# Patient Record
Sex: Male | Born: 1975 | Race: Black or African American | Hispanic: No
Health system: Southern US, Community
[De-identification: ages and names within clinical notes are randomized; demographics above are authoritative.]

## PROBLEM LIST (undated history)

## (undated) DIAGNOSIS — I609 Nontraumatic subarachnoid hemorrhage, unspecified: Secondary | ICD-10-CM

## (undated) DIAGNOSIS — S069XAA Unspecified intracranial injury with loss of consciousness status unknown, initial encounter: Secondary | ICD-10-CM

## (undated) DIAGNOSIS — H748X2 Other specified disorders of left middle ear and mastoid: Secondary | ICD-10-CM

## (undated) DIAGNOSIS — S02121S Fracture of orbital roof, right side, sequela: Secondary | ICD-10-CM

## (undated) DIAGNOSIS — S065XAA Traumatic subdural hemorrhage with loss of consciousness status unknown, initial encounter: Secondary | ICD-10-CM

## (undated) HISTORY — DX: Traumatic subdural hemorrhage with loss of consciousness status unknown, initial encounter: S06.5XAA

## (undated) HISTORY — DX: Unspecified intracranial injury with loss of consciousness status unknown, initial encounter: S06.9XAA

## (undated) HISTORY — DX: Fracture of orbital roof, right side, sequela: S02.121S

## (undated) HISTORY — DX: Other specified disorders of left middle ear and mastoid: H74.8X2

## (undated) HISTORY — DX: Nontraumatic subarachnoid hemorrhage, unspecified: I60.9

---

## 2013-12-01 ENCOUNTER — Emergency Department (HOSPITAL_COMMUNITY)
Admission: EM | Admit: 2013-12-01 | Discharge: 2013-12-01 | Disposition: A | Discharge status: Home / Self Care | Payer: Self-pay | Attending: Emergency Medicine | Admitting: Emergency Medicine

## 2013-12-01 ENCOUNTER — Encounter (HOSPITAL_COMMUNITY): Payer: Self-pay | Admitting: Emergency Medicine

## 2013-12-01 DIAGNOSIS — R52 Pain, unspecified: Secondary | ICD-10-CM | POA: Insufficient documentation

## 2013-12-01 DIAGNOSIS — J020 Streptococcal pharyngitis: Secondary | ICD-10-CM | POA: Insufficient documentation

## 2013-12-01 LAB — RAPID STREP SCREEN (MED CTR MEBANE ONLY): STREPTOCOCCUS, GROUP A SCREEN (DIRECT): POSITIVE — AB

## 2013-12-01 MED ORDER — IBUPROFEN 100 MG/5ML PO SUSP: 400.0000 mg | Freq: Four times a day (QID) | ORAL | Status: DC

## 2013-12-01 MED ORDER — IBUPROFEN 100 MG/5ML PO SUSP
600.0000 mg | Freq: Once | ORAL | Status: AC
  Administered 2013-12-01: 600 mg via ORAL
  Filled 2013-12-01 (×2): qty 30

## 2013-12-01 MED ORDER — PENICILLIN G BENZATHINE 1200000 UNIT/2ML IM SUSP
1.2000 10*6.[IU] | Freq: Once | INTRAMUSCULAR | Status: AC
  Administered 2013-12-01: 1.2 10*6.[IU] via INTRAMUSCULAR
  Filled 2013-12-01: qty 2

## 2013-12-01 MED ORDER — HYDROCODONE-ACETAMINOPHEN 7.5-325 MG/15ML PO SOLN
10.0000 mL | Freq: Once | ORAL | Status: AC
  Administered 2013-12-01: 10 mL via ORAL
  Filled 2013-12-01: qty 15

## 2013-12-01 MED ORDER — HYDROCODONE-ACETAMINOPHEN 7.5-325 MG/15ML PO SOLN: 10.0000 mL | ORAL | Status: DC | PRN

## 2013-12-01 NOTE — ED Provider Notes (Signed)
CSN: [id number]     Arrival date & time 12/01/13  1410 History  This chart was scribed for non-physician practitioner working with Harrold Donath R. Rubin Payor, MD, by Jarvis Morgan, ED Scribe. This patient was seen in room WTR8/WTR8 and the patient's care was started at 3:19 PM.    Chief Complaint  Patient presents with  . Sore Throat      The history is provided by the patient. No language interpreter was used.   HPI Comments: Ryan Nielsen is a 38 y.o. male who presents to the Emergency Department complaining of a moderate, constant sore throat since yesterday. Patient states he is having associated pain with swallowing, headache, generalized body aches and subjective fever. Patient denies any recent sick contacts. He denies any nausea, cough, congestion, rhinorrhea, or abdominal pain. States he is able to swallow liquids but makes throat pain worse.  No past medical history on file. No past surgical history on file. No family history on file. History  Substance Use Topics  . Smoking status: Never Smoker   . Smokeless tobacco: Not on file  . Alcohol Use: No    Review of Systems  Constitutional: Positive for fever (subjective).  HENT: Positive for sore throat and trouble swallowing (pain with swallowing). Negative for congestion and rhinorrhea.   Respiratory: Negative for cough.   Gastrointestinal: Negative for nausea, vomiting and abdominal pain.  All other systems reviewed and are negative.     Allergies  Review of patient's allergies indicates no known allergies.  Home Medications   Prior to Admission medications   Medication Sig Start Date End Date Taking? Authorizing Provider  HYDROcodone-acetaminophen (HYCET) 7.5-325 mg/15 ml solution Take 10 mLs by mouth every 4 (four) hours as needed for moderate pain or severe pain. 12/01/13   Junius Finner, PA-C  ibuprofen (CHILDS IBUPROFEN) 100 MG/5ML suspension Take 20 mLs (400 mg total) by mouth every 6 (six) hours. 12/01/13   Junius Finner, PA-C   Triage Vitals: BP 135/78  Pulse 85  Temp(Src) 98.5 F (36.9 C) (Oral)  Resp 16  SpO2 99%  Physical Exam  Nursing note and vitals reviewed. Constitutional: He is oriented to person, place, and time. He appears well-developed and well-nourished.  HENT:  Head: Normocephalic and atraumatic.  Mouth/Throat: Uvula is midline. No trismus in the jaw. Uvula swelling (mild) present. Posterior oropharyngeal edema and posterior oropharyngeal erythema present. No oropharyngeal exudate or tonsillar abscesses.  Eyes: EOM are normal.  Neck: Normal range of motion.  Cardiovascular: Normal rate, regular rhythm and normal heart sounds.  Exam reveals no gallop and no friction rub.   No murmur heard. Pulmonary/Chest: Effort normal and breath sounds normal. No respiratory distress. He has no wheezes. He has no rales.  Musculoskeletal: Normal range of motion.  Lymphadenopathy:    He has cervical adenopathy.  Neurological: He is alert and oriented to person, place, and time.  Skin: Skin is warm and dry.  Psychiatric: He has a normal mood and affect. His behavior is normal.    ED Course  Procedures (including critical care time)  DIAGNOSTIC STUDIES: Oxygen Saturation is 99% on RA, normal by my interpretation.    COORDINATION OF CARE: 3:27 PM- Will order Hycet and Bicillin LA injection. Pt advised of plan for treatment and pt agrees.     Labs Review Labs Reviewed  RAPID STREP SCREEN - Abnormal; Notable for the following:    Streptococcus, Group A Screen (Direct) POSITIVE (*)    All other components within normal limits  Imaging Review No results found.   EKG Interpretation None      MDM   Final diagnoses:  Strep throat    Pt is a 38yo male c/o sore throat that started yesterday. No sick contacts. No difficulty breathing.  Airway patent.  No abscess present. Rapid strep: positive.  Gave pt option of PCN in ED vs out pt PO PCN. Pt requested PCN tx in ED.  Will tx  pain with liquid ibuprofen and lortab. Advised to f/u in 3-4 days if not improving.  Return precautions provided. Pt verbalized understanding and agreement with tx plan.   I personally performed the services described in this documentation, which was scribed in my presence. The recorded information has been reviewed and is accurate.     Junius Finnerrin O'Malley, PA-C 12/01/13 1606

## 2013-12-01 NOTE — ED Notes (Signed)
He c/o sore throat and aches since yesterday.  He is in no distress, but is uncomfortable in appearance.

## 2013-12-02 NOTE — ED Provider Notes (Signed)
Medical screening examination/treatment/procedure(s) were performed by non-physician practitioner and as supervising physician I was immediately available for consultation/collaboration.   EKG Interpretation None       Juliet RudeNathan R. Rubin PayorPickering, MD 12/02/13 [phone]

## 2018-02-22 ENCOUNTER — Ambulatory Visit (INDEPENDENT_AMBULATORY_CARE_PROVIDER_SITE_OTHER): Payer: Self-pay

## 2018-02-22 ENCOUNTER — Encounter (HOSPITAL_COMMUNITY): Payer: Self-pay | Admitting: Emergency Medicine

## 2018-02-22 ENCOUNTER — Other Ambulatory Visit: Payer: Self-pay

## 2018-02-22 ENCOUNTER — Ambulatory Visit (HOSPITAL_COMMUNITY)
Admission: EM | Admit: 2018-02-22 | Discharge: 2018-02-22 | Disposition: A | Discharge status: Home / Self Care | Payer: Self-pay | Attending: Family Medicine | Admitting: Family Medicine

## 2018-02-22 DIAGNOSIS — M25571 Pain in right ankle and joints of right foot: Secondary | ICD-10-CM

## 2018-02-22 NOTE — ED Triage Notes (Addendum)
Right ankle pain after jumping over a fence.  Landed on ankle and heard a crack.  Patient reports this occurred 2 months ago.  Pain in right lateral ankle

## 2018-02-22 NOTE — Discharge Instructions (Addendum)
[facility] [address] [place], [place]  [phone]  You may use over the counter ibuprofen or acetaminophen as needed.

## 2018-02-28 NOTE — ED Provider Notes (Signed)
[facility]   [id number] 02/22/18 Arrival Time: 1845  ASSESSMENT & PLAN:  1. Right ankle pain, unspecified chronicity     Imaging: Dg Ankle Complete Right  Result Date: 02/22/2018 CLINICAL DATA:  Pain after trauma EXAM: RIGHT ANKLE - COMPLETE 3+ VIEW COMPARISON:  None. FINDINGS: A well corticated calcification adjacent the distal fibula is likely from an old avulsion injury. No soft tissue swelling identified. No acute fracture identified. IMPRESSION: Negative. Electronically Signed   By: Gerome Sam III M.D   On: 02/22/2018 19:44   Natural history and expected course discussed. Questions answered. Rest, ice, compression, elevation (RICE) therapy.  Recommend orthopaedic follow up.  Reviewed expectations re: course of current medical issues. Questions answered. Outlined signs and symptoms indicating need for more acute intervention. Patient verbalized understanding. After Visit Summary given.  SUBJECTIVE: History from: patient. Ryan Nielsen is a 42 y.o. male who reports intermittent moderate pain of his right ankle that has been intermittent since beginning; described as aching without radiation. Onset: gradual, reports jumping fence 2 months ago and 'hearing a crack'; ambulatory since with occasional exacerbation of lateral ankle pain. Relieved by: rest. Worsened by: movement. Associated symptoms: none reported. Extremity sensation changes or weakness: none. Self treatment: tried OTCs without relief of pain. History of similar: no  ROS: As per HPI.   OBJECTIVE:  Vitals:   02/22/18 1919  BP: 123/75  Pulse: 87  Resp: 18  Temp: 97.9 F (36.6 C)  TempSrc: Oral  SpO2: 100%    General appearance: alert; no distress Extremities: warm and well perfused; symmetrical with no gross deformities; diffuse tenderness over his right lateral ankle with no swelling and no bruising; ROM around area or areas of discomfort: normal CV: brisk extremity capillary refill Skin:  warm and dry Neurologic: normal gait; normal symmetric reflexes in all extremities; normal sensation in all extremities Psychological: alert and cooperative; normal mood and affect  No Known Allergies   Social History   Socioeconomic History  . Marital status: Single    Spouse name: Not on file  . Number of children: Not on file  . Years of education: Not on file  . Highest education level: Not on file  Occupational History  . Not on file  Social Needs  . Financial resource strain: Not on file  . Food insecurity:    Worry: Not on file    Inability: Not on file  . Transportation needs:    Medical: Not on file    Non-medical: Not on file  Tobacco Use  . Smoking status: Current Some Day Smoker  Substance and Sexual Activity  . Alcohol use: No  . Drug use: Never  . Sexual activity: Not on file  Lifestyle  . Physical activity:    Days per week: Not on file    Minutes per session: Not on file  . Stress: Not on file  Relationships  . Social connections:    Talks on phone: Not on file    Gets together: Not on file    Attends religious service: Not on file    Active member of club or organization: Not on file    Attends meetings of clubs or organizations: Not on file    Relationship status: Not on file  Other Topics Concern  . Not on file  Social History Narrative  . Not on file   History reviewed. No pertinent family history. History reviewed. No pertinent surgical history.    Mardella Layman, MD 02/28/18 [phone]

## 2018-06-07 ENCOUNTER — Encounter (HOSPITAL_COMMUNITY): Payer: Self-pay | Admitting: Emergency Medicine

## 2018-06-07 ENCOUNTER — Other Ambulatory Visit: Payer: Self-pay

## 2018-06-07 ENCOUNTER — Emergency Department (HOSPITAL_COMMUNITY)
Admission: EM | Admit: 2018-06-07 | Discharge: 2018-06-07 | Disposition: A | Discharge status: Home / Self Care | Payer: Self-pay | Attending: Emergency Medicine | Admitting: Emergency Medicine

## 2018-06-07 DIAGNOSIS — S0181XD Laceration without foreign body of other part of head, subsequent encounter: Secondary | ICD-10-CM | POA: Insufficient documentation

## 2018-06-07 DIAGNOSIS — Z4802 Encounter for removal of sutures: Secondary | ICD-10-CM

## 2018-06-07 DIAGNOSIS — X58XXXD Exposure to other specified factors, subsequent encounter: Secondary | ICD-10-CM | POA: Insufficient documentation

## 2018-06-07 DIAGNOSIS — F17200 Nicotine dependence, unspecified, uncomplicated: Secondary | ICD-10-CM | POA: Insufficient documentation

## 2018-06-07 MED ORDER — CEPHALEXIN 500 MG PO CAPS: 500.0000 mg | ORAL_CAPSULE | Freq: Four times a day (QID) | ORAL | 0 refills | Status: AC

## 2018-06-07 NOTE — ED Provider Notes (Signed)
[facility] Provider Note   CSN: [id number] Arrival date & time: 06/07/18  1513     History   Chief Complaint Chief Complaint  Patient presents with  . Suture / Staple Removal    HPI Ryan Nielsen is a 43 y.o. male who presents today for evaluation of suture removal.  He had sutures placed on his right side forehead on 12/31.  Chart review through care everywhere shows that he had 5 sutures placed.  He denies any fevers or drainage.  He is aware that he was supposed to get the sutures out 5 days after being placed.  HPI  History reviewed. No pertinent past medical history.  There are no active problems to display for this patient.   History reviewed. No pertinent surgical history.      Home Medications    Prior to Admission medications   Medication Sig Start Date End Date Taking? Authorizing Provider  cephALEXin (KEFLEX) 500 MG capsule Take 1 capsule (500 mg total) by mouth 4 (four) times daily for 7 days. 06/07/18 06/14/18  Cristina GongHammond, Drayton Tieu W, PA-C  HYDROcodone-acetaminophen (HYCET) 7.5-325 mg/15 ml solution Take 10 mLs by mouth every 4 (four) hours as needed for moderate pain or severe pain. 12/01/13   Lurene ShadowPhelps, Erin O, PA-C  ibuprofen (CHILDS IBUPROFEN) 100 MG/5ML suspension Take 20 mLs (400 mg total) by mouth every 6 (six) hours. 12/01/13   Lurene ShadowPhelps, Erin O, PA-C    Family History No family history on file.  Social History Social History   Tobacco Use  . Smoking status: Current Some Day Smoker  . Smokeless tobacco: Never Used  Substance Use Topics  . Alcohol use: Yes  . Drug use: Never     Allergies   Patient has no known allergies.   Review of Systems Review of Systems  Constitutional: Negative for chills and fever.  Skin: Negative for color change.  All other systems reviewed and are negative.    Physical Exam Updated Vital Signs There were no vitals taken for this visit.  Physical Exam Vitals signs and nursing  note reviewed.  Constitutional:      General: He is not in acute distress.    Appearance: He is normal weight.  HENT:     Head: Normocephalic.     Comments: 2cm laceration over right eyebrow with significant scabbing.  No abnormal drainage, underlying swelling, fluctuance or redness.  Skin:    Comments: Skin is warm, dry  Neurological:     General: No focal deficit present.     Mental Status: He is alert.  Psychiatric:        Mood and Affect: Mood normal.      ED Treatments / Results  Labs (all labs ordered are listed, but only abnormal results are displayed) Labs Reviewed - No data to display  EKG None  Radiology No results found.  Procedures .Suture Removal Date/Time: 06/07/2018 4:11 PM Performed by: Cristina GongHammond, Tonea Leiphart W, PA-C Authorized by: Cristina GongHammond, Jamil Armwood W, PA-C   Consent:    Consent obtained:  Verbal   Consent given by:  Patient   Risks discussed:  Bleeding, pain and wound separation (retained suture material, infection)   Alternatives discussed:  No treatment and alternative treatment Location:    Location:  Head/neck   Head/neck location:  Scalp Procedure details:    Wound appearance:  No signs of infection, good wound healing and clean (Large scab)   Number of sutures removed:  5 (Suture closest to eyebrow  appeared to be partially broken after removal.  area gently explored with out visualized suture material remaining. ) Post-procedure details:    Post-removal:  No dressing applied   Patient tolerance of procedure:  Tolerated well, no immediate complications Comments:     Prior to suture removal the area was cleaned with ChloraPrep   (including critical care time)  Medications Ordered in ED Medications - No data to display   Initial Impression / Assessment and Plan / ED Course  I have reviewed the triage vital signs and the nursing notes.  Pertinent labs & imaging results that were available during my care of the patient were reviewed by me and  considered in my medical decision making (see chart for details).    suture removal   Pt to ER for suture removal and wound check as above. Procedure tolerated well. Vitals normal, no signs of infection. Scar minimization & return precautions given at dc.  There was concern, based on short length of suture removed from suture closest to right eyebrow of retained suture material, or that it had broken.  Patient does not have good outpatient follow-up.  We discussed additional options.  Given possibility of retained foreign body with manipulation, he is given a prescription for Keflex with instructions on when he would need to start it.  Return precautions were discussed with patient who states their understanding.  At the time of discharge patient denied any unaddressed complaints or concerns.  Patient is agreeable for discharge home.    Final Clinical Impressions(s) / ED Diagnoses   Final diagnoses:  Encounter for removal of sutures    ED Discharge Orders         Ordered    cephALEXin (KEFLEX) 500 MG capsule  4 times daily     06/07/18 1618           Cristina Gong, [place] 06/07/18 1619    Wynetta Fines, MD 06/07/18 [phone]

## 2018-06-07 NOTE — ED Triage Notes (Signed)
Pt here for suture removal.  Sutures to R forehead since 12/31.  Denies complaint.

## 2018-06-07 NOTE — Discharge Instructions (Signed)
Please take Ibuprofen (Advil, motrin) and Tylenol (acetaminophen) to relieve your pain.  You may take up to 600 MG (3 pills) of normal strength ibuprofen every 8 hours as needed.  In between doses of ibuprofen you make take tylenol, up to 1,000 mg (two extra strength pills).  Do not take more than 3,000 mg tylenol in a 24 hour period.  Please check all medication labels as many medications such as pain and cold medications may contain tylenol.  Do not drink alcohol while taking these medications.  Do not take other NSAID'S while taking ibuprofen (such as aleve or naproxen).  Please take ibuprofen with food to decrease stomach upset.  I have given you a prescription for keflex which is an antibiotic.  If you develop significiant redness, increased pain, or have thick drainage from the wound please start taking these antibiotics.  You need to take the entire course.  If these do not improve your symptoms in 2 days or they get much worse seek additional medical care.  As we discussed if you have stitches material left in your wound it will gradually work its way out of your body.

## 2018-06-21 ENCOUNTER — Other Ambulatory Visit: Payer: Self-pay

## 2018-06-21 ENCOUNTER — Emergency Department (HOSPITAL_COMMUNITY)
Admission: EM | Admit: 2018-06-21 | Discharge: 2018-06-21 | Disposition: A | Discharge status: Home / Self Care | Payer: Self-pay | Attending: Emergency Medicine | Admitting: Emergency Medicine

## 2018-06-21 ENCOUNTER — Emergency Department (HOSPITAL_COMMUNITY): Payer: Self-pay

## 2018-06-21 DIAGNOSIS — F17290 Nicotine dependence, other tobacco product, uncomplicated: Secondary | ICD-10-CM | POA: Insufficient documentation

## 2018-06-21 DIAGNOSIS — J40 Bronchitis, not specified as acute or chronic: Secondary | ICD-10-CM | POA: Insufficient documentation

## 2018-06-21 DIAGNOSIS — Z79899 Other long term (current) drug therapy: Secondary | ICD-10-CM | POA: Insufficient documentation

## 2018-06-21 MED ORDER — PREDNISONE 50 MG PO TABS: 50.0000 mg | ORAL_TABLET | Freq: Every day | ORAL | 0 refills | Status: AC

## 2018-06-21 MED ORDER — ALBUTEROL SULFATE HFA 108 (90 BASE) MCG/ACT IN AERS: 1.0000 | INHALATION_SPRAY | Freq: Four times a day (QID) | RESPIRATORY_TRACT | 0 refills | Status: DC | PRN

## 2018-06-21 NOTE — ED Triage Notes (Signed)
Patient c/o chills, HA, cold/flu sx x3 days.

## 2018-06-21 NOTE — ED Provider Notes (Signed)
[facility] EMERGENCY DEPARTMENT Provider Note   CSN: [id number] Arrival date & time: 06/21/18  0243     History   Chief Complaint Chief Complaint  Patient presents with  . URI    HPI Ryan Nielsen is a 43 y.o. male.  The history is provided by the patient. No language interpreter was used.  URI  Presenting symptoms: congestion and cough   Severity:  Moderate Onset quality:  Gradual Duration:  3 days Timing:  Constant Progression:  Worsening Chronicity:  New Relieved by:  Nothing Worsened by:  Nothing Risk factors: no recent illness     No past medical history on file.  There are no active problems to display for this patient.   No past surgical history on file.      Home Medications    Prior to Admission medications   Medication Sig Start Date End Date Taking? Authorizing Provider  albuterol (PROVENTIL HFA;VENTOLIN HFA) 108 (90 Base) MCG/ACT inhaler Inhale 1-2 puffs into the lungs every 6 (six) hours as needed for wheezing or shortness of breath. 06/21/18   Elson Areas, PA-C  HYDROcodone-acetaminophen (HYCET) 7.5-325 mg/15 ml solution Take 10 mLs by mouth every 4 (four) hours as needed for moderate pain or severe pain. 12/01/13   Lurene Shadow, PA-C  ibuprofen (CHILDS IBUPROFEN) 100 MG/5ML suspension Take 20 mLs (400 mg total) by mouth every 6 (six) hours. 12/01/13   Lurene Shadow, PA-C  predniSONE (DELTASONE) 50 MG tablet Take 1 tablet (50 mg total) by mouth daily for 5 days. 06/21/18 06/26/18  Elson Areas, PA-C    Family History No family history on file.  Social History Social History   Tobacco Use  . Smoking status: Current Some Day Smoker  . Smokeless tobacco: Never Used  Substance Use Topics  . Alcohol use: Yes  . Drug use: Never     Allergies   Patient has no known allergies.   Review of Systems Review of Systems  HENT: Positive for congestion.   Respiratory: Positive for cough.   All other systems reviewed and are  negative.    Physical Exam Updated Vital Signs BP 135/70 (BP Location: Right Arm)   Pulse 94   Temp 98.3 F (36.8 C) (Oral)   Resp 19   SpO2 95%   Physical Exam Vitals signs and nursing note reviewed.  Constitutional:      Appearance: He is well-developed.  HENT:     Head: Normocephalic.     Right Ear: Tympanic membrane normal.     Left Ear: Tympanic membrane normal.     Nose: Nose normal.     Mouth/Throat:     Mouth: Mucous membranes are moist.  Neck:     Musculoskeletal: Normal range of motion.  Cardiovascular:     Rate and Rhythm: Normal rate.  Pulmonary:     Effort: Pulmonary effort is normal.  Abdominal:     General: There is no distension.  Musculoskeletal: Normal range of motion.  Skin:    General: Skin is warm.  Neurological:     Mental Status: He is alert and oriented to person, place, and time.  Psychiatric:        Mood and Affect: Mood normal.      ED Treatments / Results  Labs (all labs ordered are listed, but only abnormal results are displayed) Labs Reviewed - No data to display  EKG None  Radiology Dg Chest 2 View  Result Date: 06/21/2018 CLINICAL DATA:  Cough and congestion. EXAM: CHEST - 2 VIEW COMPARISON:  None. FINDINGS: The cardiomediastinal contours are normal. Mild bronchitic change. Pulmonary vasculature is normal. No consolidation, pleural effusion, or pneumothorax. No acute osseous abnormalities are seen. IMPRESSION: Mild bronchitic change. Electronically Signed   By: Narda Rutherford M.D.   On: 06/21/2018 03:29    Procedures Procedures (including critical care time)  Medications Ordered in ED Medications - No data to display   Initial Impression / Assessment and Plan / ED Course  I have reviewed the triage vital signs and the nursing notes.  Pertinent labs & imaging results that were available during my care of the patient were reviewed by me and considered in my medical decision making (see chart for details).     MDM   Chest xray shows bronchitis. Pt smokes cigars.  Pt given rx for prednisone and albuterol.   Final Clinical Impressions(s) / ED Diagnoses   Final diagnoses:  Bronchitis    ED Discharge Orders         Ordered    albuterol (PROVENTIL HFA;VENTOLIN HFA) 108 (90 Base) MCG/ACT inhaler  Every 6 hours PRN     06/21/18 0730    predniSONE (DELTASONE) 50 MG tablet  Daily     06/21/18 0730        An After Visit Summary was printed and given to the patient.    Elson Areas, [place] 06/21/18 8127    Gerhard Munch, MD 06/21/18 [phone]

## 2018-09-08 ENCOUNTER — Encounter: Payer: Self-pay | Admitting: *Deleted

## 2018-09-08 NOTE — Congregational Nurse Program (Signed)
COVID 19 Hotel Screening: Patient denies any concerns at this time.

## 2018-09-13 ENCOUNTER — Other Ambulatory Visit: Payer: Self-pay

## 2018-09-13 ENCOUNTER — Encounter (HOSPITAL_COMMUNITY): Payer: Self-pay

## 2018-09-13 ENCOUNTER — Ambulatory Visit (HOSPITAL_COMMUNITY)
Admission: EM | Admit: 2018-09-13 | Discharge: 2018-09-13 | Disposition: A | Discharge status: Home / Self Care | Payer: Self-pay | Attending: Family Medicine | Admitting: Family Medicine

## 2018-09-13 DIAGNOSIS — M545 Low back pain, unspecified: Secondary | ICD-10-CM

## 2018-09-13 MED ORDER — IBUPROFEN 800 MG PO TABS: 800.0000 mg | ORAL_TABLET | Freq: Three times a day (TID) | ORAL | 0 refills | Status: DC

## 2018-09-13 MED ORDER — CYCLOBENZAPRINE HCL 5 MG PO TABS: 5.0000 mg | ORAL_TABLET | Freq: Two times a day (BID) | ORAL | 0 refills | Status: DC | PRN
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
## 2018-09-13 NOTE — ED Notes (Signed)
Patient verbalizes understanding of discharge instructions. Opportunity for questioning and answers were provided. Patient discharged from [facility] by provider.  

## 2018-09-13 NOTE — Discharge Instructions (Signed)
Pain may worsen over the next 24 to 48 hours followed by gradual improvement day by day over the next couple of weeks  Use anti-inflammatories for pain/swelling. You may take up to 800 mg Ibuprofen every 8 hours with food. You may supplement Ibuprofen with Tylenol [phone] mg every 8 hours.   You may use flexeril as needed to help with pain. This is a muscle relaxer and causes sedation- please use only at bedtime or when you will be home and not have to drive/work- begin with 1 tablet, may increase to 2 if needed  Alternate ice and heat to back.  Avoid heavy lifting, avoid complete bedrest  Follow-up if pain continuing to worsen, developing weakness, numbness or tingling, issues with urination or bowel movements
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
## 2018-09-15 NOTE — Progress Notes (Signed)
COVID Hotel Screening performed. Temperature, PHQ-9, and need for medical care and medications assessed. No other needs assessed at this time.  Annabeth Tortora RN MSN 

## 2018-09-22 NOTE — Progress Notes (Signed)
COVID Hotel Screening performed. Temperature, PHQ-9, and need for medical care and medications assessed. No additional needs assessed at this time.  Gerene Nedd RN MSN 

## 2018-09-25 NOTE — Congregational Nurse Program (Signed)
Closing encounter per request. 

## 2018-09-29 NOTE — Progress Notes (Signed)
COVID Hotel Screening performed. Temperature, PHQ-9, and need for medical care and medications assessed. No additional needs assessed at this time.  Corlette Ciano RN MSN 

## 2018-10-06 ENCOUNTER — Encounter: Payer: Self-pay | Admitting: *Deleted

## 2018-10-06 NOTE — Progress Notes (Signed)
COVID Hotel Screening performed. Temperature, PHQ-9, and need for medical care and medications assessed. No additional needs assessed at this time.   Ryan Nielsen 

## 2018-10-13 NOTE — Progress Notes (Signed)
COVID Hotel Screening performed. Temperature, PHQ-9, and need for medical care and medications assessed. No additional needs assessed at this time.  Deontra Pereyra RN MSN 

## 2018-10-17 ENCOUNTER — Telehealth: Payer: Self-pay | Admitting: *Deleted

## 2018-10-17 DIAGNOSIS — Z20822 Contact with and (suspected) exposure to covid-19: Secondary | ICD-10-CM

## 2018-10-17 NOTE — Telephone Encounter (Signed)
Order placed for COVID-19 testing.  

## 2018-10-20 NOTE — Progress Notes (Signed)
COVID Hotel Screening performed. Temperature, PHQ-9, and need for medical care and medications assessed. Patient agreed to the COVID-19 testing. No additional needs assessed at this time.  Evelean Bigler RN MSN 

## 2018-10-25 LAB — NOVEL CORONAVIRUS, NAA: SARS-CoV-2, NAA: NOT DETECTED

## 2018-10-26 ENCOUNTER — Encounter: Payer: Self-pay | Admitting: *Deleted

## 2018-10-26 ENCOUNTER — Telehealth: Payer: Self-pay

## 2018-10-26 NOTE — Progress Notes (Signed)
COVID Hotel Screening performed. Temperature, PHQ-9, and need for medical care and medications assessed. No additional needs assessed at this time.  Marcy Sookdeo RN MSN 

## 2018-10-26 NOTE — Telephone Encounter (Signed)
Patient advised COVID test results were negative.

## 2018-11-10 NOTE — Progress Notes (Signed)
COVID Hotel Screening performed. Temperature, PHQ-9, and need for medical care and medications assessed. No additional needs assessed at this time. 

## 2018-12-01 ENCOUNTER — Other Ambulatory Visit: Payer: Self-pay

## 2018-12-01 DIAGNOSIS — Z20822 Contact with and (suspected) exposure to covid-19: Secondary | ICD-10-CM

## 2018-12-07 LAB — NOVEL CORONAVIRUS, NAA: SARS-CoV-2, NAA: NOT DETECTED

## 2019-01-14 NOTE — Progress Notes (Signed)
COVID-19 Screening performed. Temperature, PHQ-9, and need for medical care and medications assessed. No additional needs assessed at this time.  Barnet Benavides MSN, RN 

## 2019-01-21 NOTE — Progress Notes (Signed)
COVID-19 Screening performed. Temperature, PHQ-9, and need for medical care and medications assessed. No additional needs assessed at this time.  Amair Shrout MSN, RN 
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
## 2020-01-25 IMAGING — DX DG CHEST 2V
2 series · 2 of 2 positions shown · non-contrast
Comparison: None.

CLINICAL DATA: Cough and congestion.

EXAM:
CHEST - 2 VIEW

[chest pa]
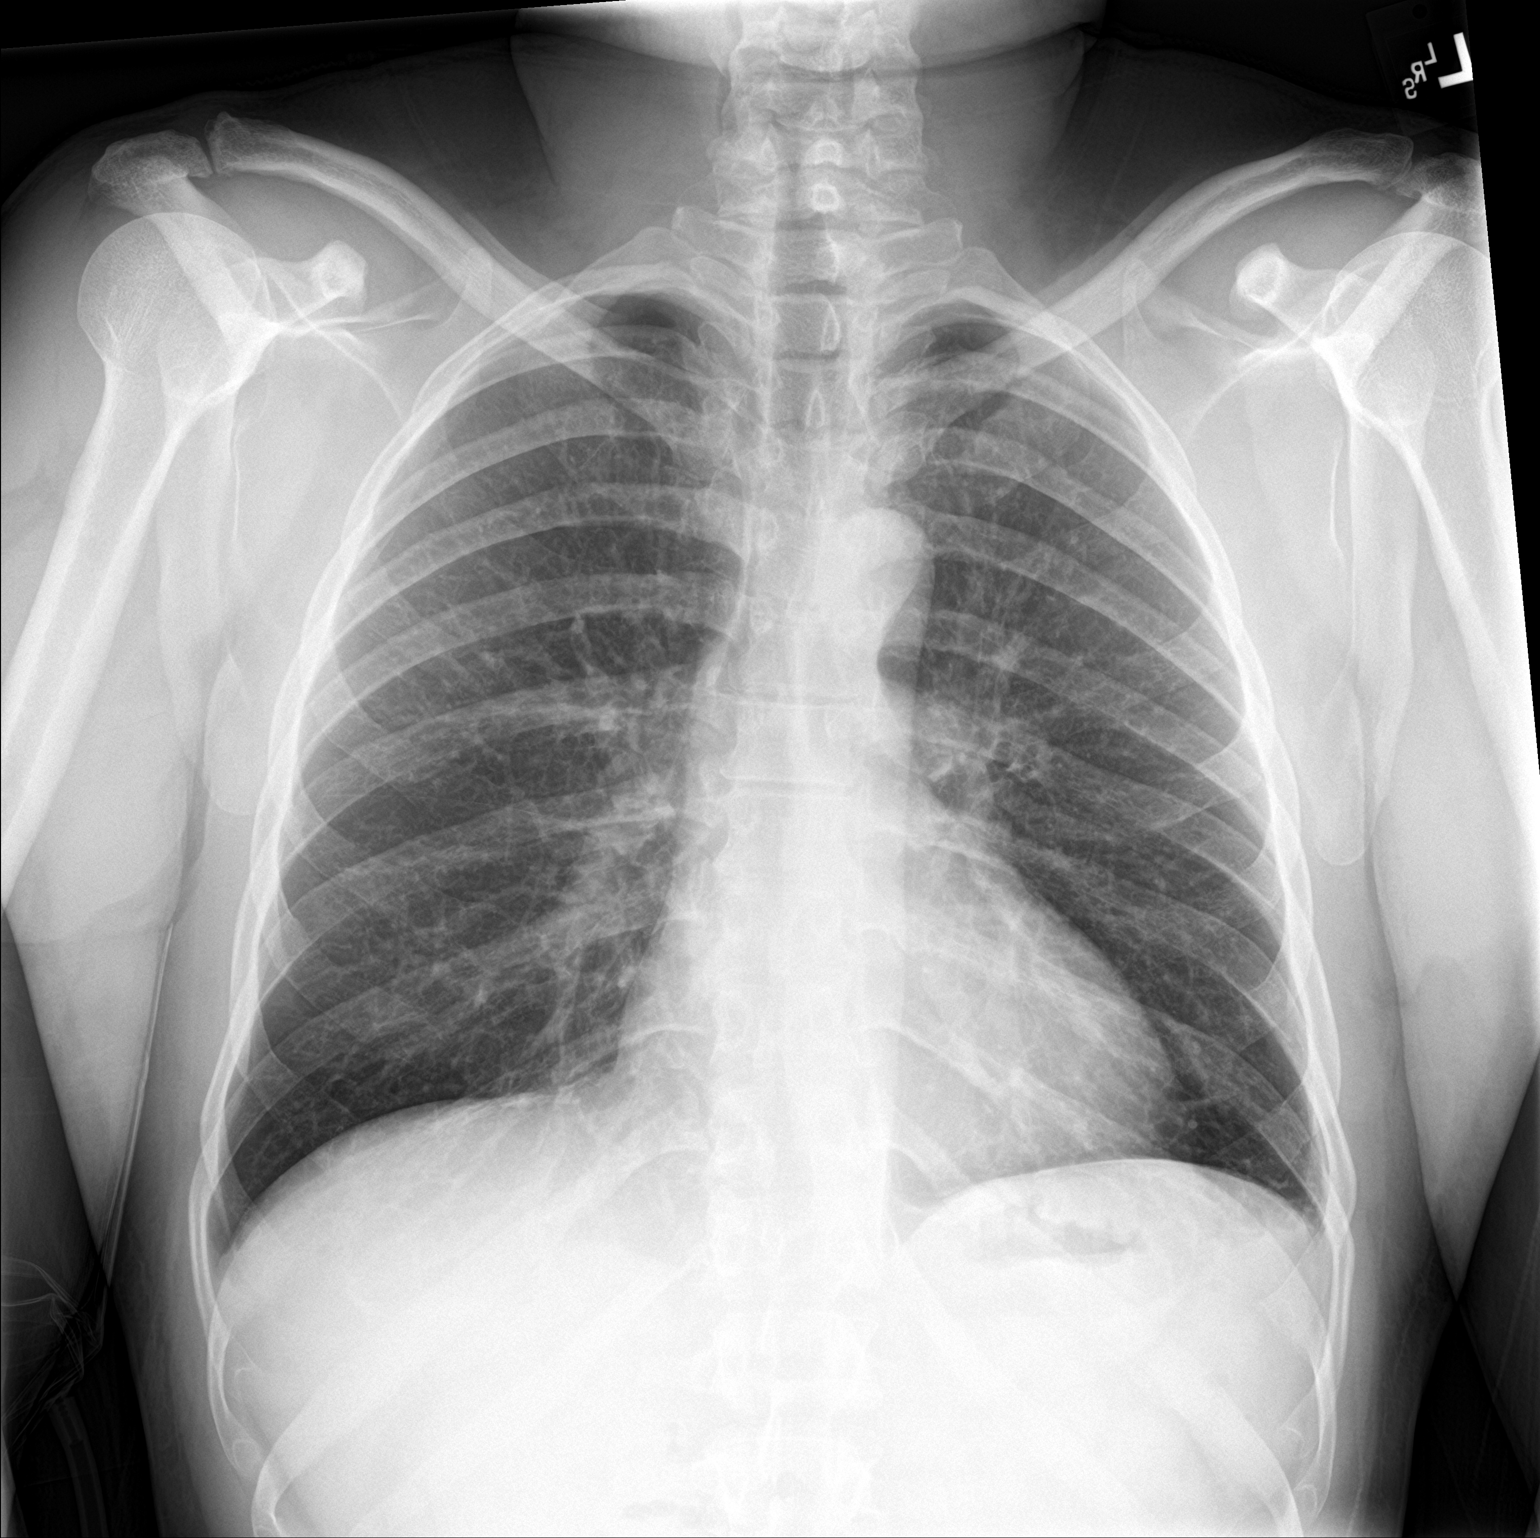

[chest lat]
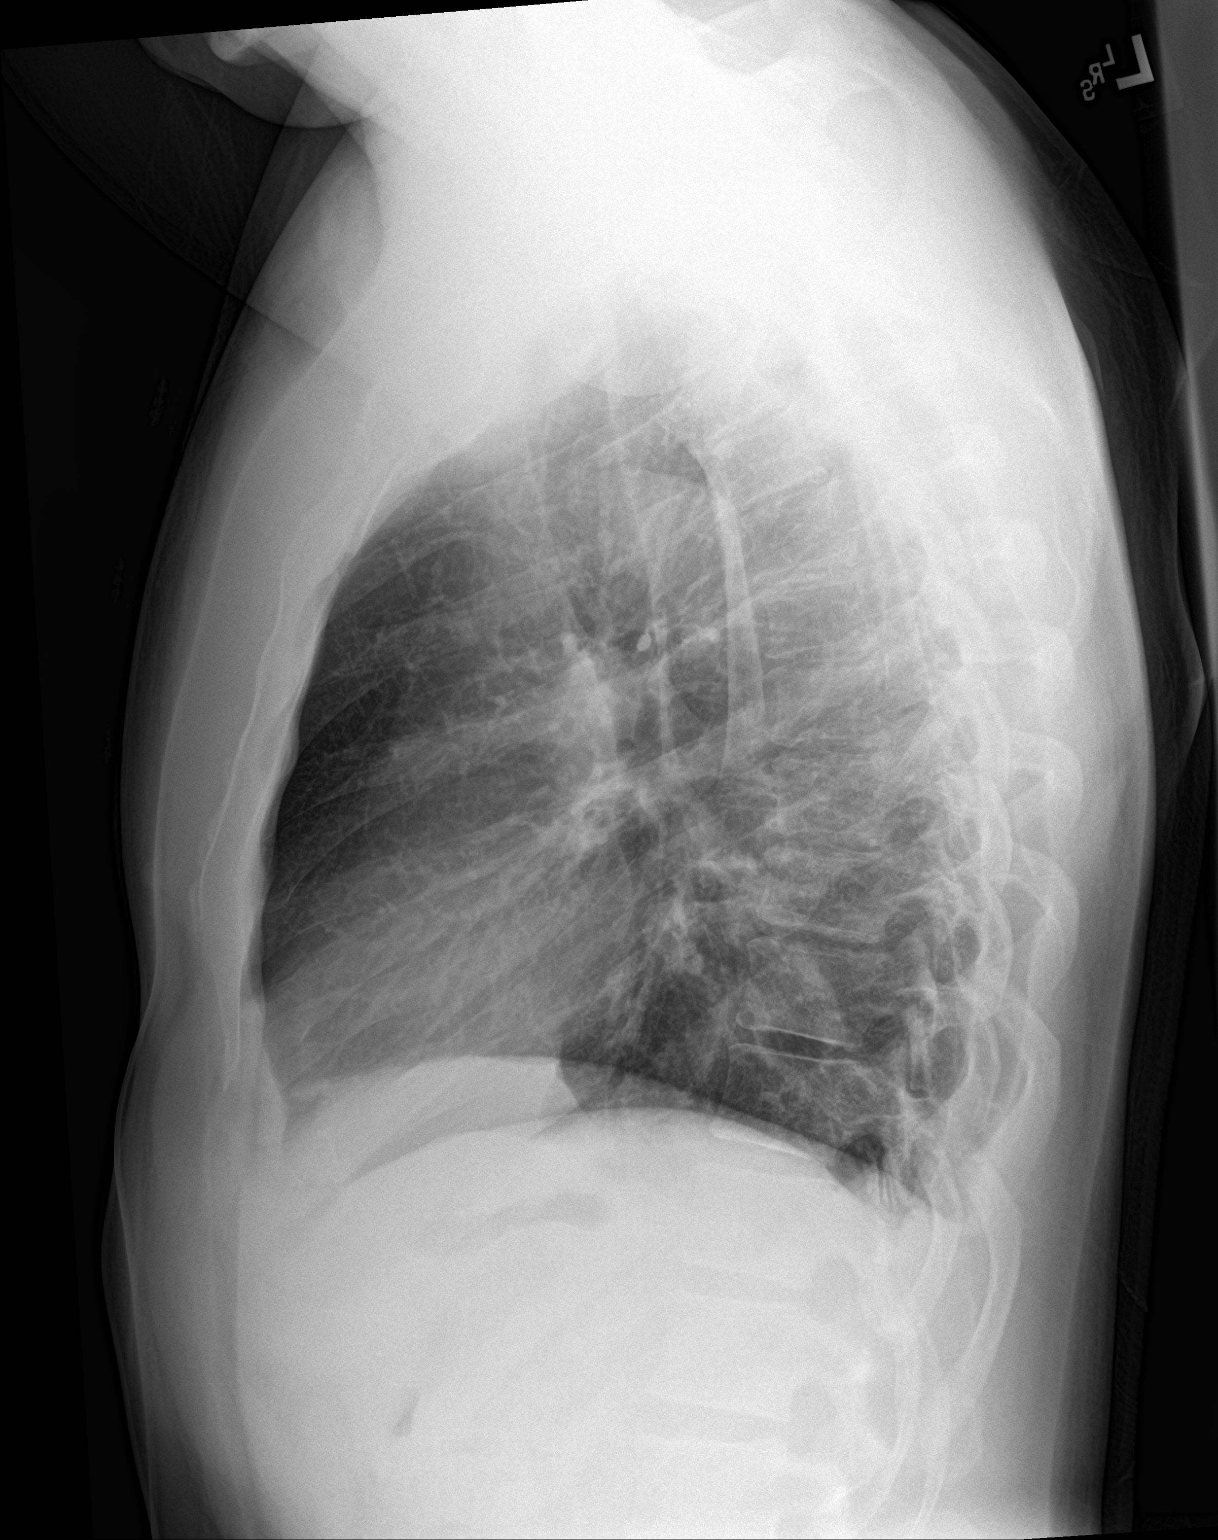

[2 of 2 positions shown; findings below may reference images not displayed]

FINDINGS: The cardiomediastinal contours are normal. Mild bronchitic change.
Pulmonary vasculature is normal. No consolidation, pleural effusion,
or pneumothorax. No acute osseous abnormalities are seen.
IMPRESSION: Mild bronchitic change.

## 2021-10-27 ENCOUNTER — Inpatient Hospital Stay (HOSPITAL_COMMUNITY)
Admission: EM | Admit: 2021-10-27 | Discharge: 2021-11-26 | DRG: 082 | Disposition: A | Discharge status: Home / Self Care | Payer: Self-pay | Attending: General Surgery | Admitting: General Surgery

## 2021-10-27 ENCOUNTER — Emergency Department (HOSPITAL_COMMUNITY): Payer: Self-pay

## 2021-10-27 ENCOUNTER — Encounter (HOSPITAL_COMMUNITY): Payer: Self-pay

## 2021-10-27 DIAGNOSIS — I609 Nontraumatic subarachnoid hemorrhage, unspecified: Secondary | ICD-10-CM | POA: Diagnosis not present

## 2021-10-27 DIAGNOSIS — R451 Restlessness and agitation: Secondary | ICD-10-CM | POA: Diagnosis present

## 2021-10-27 DIAGNOSIS — S065XAA Traumatic subdural hemorrhage with loss of consciousness status unknown, initial encounter: Principal | ICD-10-CM | POA: Diagnosis present

## 2021-10-27 DIAGNOSIS — H40051 Ocular hypertension, right eye: Secondary | ICD-10-CM | POA: Diagnosis present

## 2021-10-27 DIAGNOSIS — J14 Pneumonia due to Hemophilus influenzae: Secondary | ICD-10-CM | POA: Diagnosis not present

## 2021-10-27 DIAGNOSIS — R402242 Coma scale, best verbal response, confused conversation, at arrival to emergency department: Secondary | ICD-10-CM | POA: Diagnosis present

## 2021-10-27 DIAGNOSIS — E876 Hypokalemia: Secondary | ICD-10-CM

## 2021-10-27 DIAGNOSIS — R402362 Coma scale, best motor response, obeys commands, at arrival to emergency department: Secondary | ICD-10-CM | POA: Diagnosis present

## 2021-10-27 DIAGNOSIS — B9561 Methicillin susceptible Staphylococcus aureus infection as the cause of diseases classified elsewhere: Secondary | ICD-10-CM | POA: Diagnosis not present

## 2021-10-27 DIAGNOSIS — R402132 Coma scale, eyes open, to sound, at arrival to emergency department: Secondary | ICD-10-CM | POA: Diagnosis present

## 2021-10-27 DIAGNOSIS — Z20822 Contact with and (suspected) exposure to covid-19: Secondary | ICD-10-CM | POA: Diagnosis present

## 2021-10-27 DIAGNOSIS — H73892 Other specified disorders of tympanic membrane, left ear: Secondary | ICD-10-CM | POA: Diagnosis present

## 2021-10-27 DIAGNOSIS — S02121A Fracture of orbital roof, right side, initial encounter for closed fracture: Secondary | ICD-10-CM | POA: Diagnosis present

## 2021-10-27 DIAGNOSIS — Z781 Physical restraint status: Secondary | ICD-10-CM

## 2021-10-27 DIAGNOSIS — J9601 Acute respiratory failure with hypoxia: Secondary | ICD-10-CM | POA: Diagnosis present

## 2021-10-27 DIAGNOSIS — S066XAA Traumatic subarachnoid hemorrhage with loss of consciousness status unknown, initial encounter: Secondary | ICD-10-CM | POA: Diagnosis present

## 2021-10-27 DIAGNOSIS — S0219XA Other fracture of base of skull, initial encounter for closed fracture: Secondary | ICD-10-CM | POA: Diagnosis present

## 2021-10-27 LAB — URINALYSIS, ROUTINE W REFLEX MICROSCOPIC
Bilirubin Urine: NEGATIVE
Glucose, UA: NEGATIVE mg/dL
Ketones, ur: NEGATIVE mg/dL
Nitrite: NEGATIVE
Protein, ur: 100 mg/dL — AB
Specific Gravity, Urine: 1.026 (ref 1.005–1.030)
WBC, UA: >50 WBC/hpf — ABNORMAL HIGH (ref 0–5)
pH: 5 (ref 5.0–8.0)

## 2021-10-27 LAB — CBC
HCT: 45.9 % (ref 39.0–52.0)
Hemoglobin: 15.1 g/dL (ref 13.0–17.0)
MCH: 32.5 pg (ref 26.0–34.0)
MCHC: 32.9 g/dL (ref 30.0–36.0)
MCV: 98.7 fL (ref 80.0–100.0)
Platelets: 332 10*3/uL (ref 150–400)
RBC: 4.65 MIL/uL (ref 4.22–5.81)
RDW: 12.8 % (ref 11.5–15.5)
WBC: 15.3 10*3/uL — ABNORMAL HIGH (ref 4.0–10.5)
nRBC: 0 % (ref 0.0–0.2)

## 2021-10-27 LAB — LACTIC ACID, PLASMA: Lactic Acid, Venous: 6 mmol/L (ref 0.5–1.9)

## 2021-10-27 LAB — PROTIME-INR
INR: 1.1 (ref 0.8–1.2)
Prothrombin Time: 13.8 seconds (ref 11.4–15.2)

## 2021-10-27 LAB — COMPREHENSIVE METABOLIC PANEL
ALT: 26 U/L (ref 0–44)
AST: 50 U/L — ABNORMAL HIGH (ref 15–41)
Albumin: 3.5 g/dL (ref 3.5–5.0)
Alkaline Phosphatase: 56 U/L (ref 38–126)
Anion gap: 16 — ABNORMAL HIGH (ref 5–15)
BUN: 16 mg/dL (ref 6–20)
CO2: 19 mmol/L — ABNORMAL LOW (ref 22–32)
Calcium: 8.9 mg/dL (ref 8.9–10.3)
Chloride: 103 mmol/L (ref 98–111)
Creatinine, Ser: 1.15 mg/dL (ref 0.61–1.24)
GFR, Estimated: >60 mL/min (ref >60)
Glucose, Bld: 142 mg/dL — ABNORMAL HIGH (ref 70–99)
Potassium: 2.7 mmol/L — CL (ref 3.5–5.1)
Sodium: 138 mmol/L (ref 135–145)
Total Bilirubin: 0.8 mg/dL (ref 0.3–1.2)
Total Protein: 7.1 g/dL (ref 6.5–8.1)

## 2021-10-27 LAB — I-STAT CHEM 8, ED
BUN: 17 mg/dL (ref 6–20)
Calcium, Ion: 1.04 mmol/L — ABNORMAL LOW (ref 1.15–1.40)
Chloride: 104 mmol/L (ref 98–111)
Creatinine, Ser: 0.9 mg/dL (ref 0.61–1.24)
Glucose, Bld: 144 mg/dL — ABNORMAL HIGH (ref 70–99)
HCT: 47 % (ref 39.0–52.0)
Hemoglobin: 16 g/dL (ref 13.0–17.0)
Potassium: 2.8 mmol/L — ABNORMAL LOW (ref 3.5–5.1)
Sodium: 139 mmol/L (ref 135–145)
TCO2: 19 mmol/L — ABNORMAL LOW (ref 22–32)

## 2021-10-27 LAB — ETHANOL: Alcohol, Ethyl (B): <10 mg/dL (ref <10)

## 2021-10-27 MED ORDER — TRANEXAMIC ACID 1000 MG/10ML IV SOLN
1000.0000 mg | Freq: Once | INTRAVENOUS | Status: AC
  Administered 2021-10-28: 1000 mg via INTRAVENOUS
  Filled 2021-10-27: qty 10

## 2021-10-27 MED ORDER — FENTANYL CITRATE PF 50 MCG/ML IJ SOSY
100.0000 ug | PREFILLED_SYRINGE | Freq: Once | INTRAMUSCULAR | Status: AC
  Administered 2021-10-27: 100 ug via INTRAVENOUS

## 2021-10-27 MED ORDER — SUCCINYLCHOLINE CHLORIDE 20 MG/ML IJ SOLN
INTRAMUSCULAR | Status: AC | PRN
  Administered 2021-10-27: 120 mg via INTRAVENOUS

## 2021-10-27 MED ORDER — BRIMONIDINE TARTRATE 0.2 % OP SOLN
1.0000 [drp] | Freq: Once | OPHTHALMIC | Status: DC
  Filled 2021-10-27: qty 5

## 2021-10-27 MED ORDER — SODIUM CHLORIDE 0.9 % IV SOLN
INTRAVENOUS | Status: AC | PRN
  Administered 2021-10-27: 100 mL/h via INTRAVENOUS

## 2021-10-27 MED ORDER — POTASSIUM CHLORIDE 10 MEQ/100ML IV SOLN
10.0000 meq | INTRAVENOUS | Status: AC
  Administered 2021-10-27 – 2021-10-28 (×2): 10 meq via INTRAVENOUS
  Filled 2021-10-27 (×2): qty 100

## 2021-10-27 MED ORDER — MIDAZOLAM HCL 2 MG/2ML IJ SOLN
2.0000 mg | INTRAMUSCULAR | Status: DC | PRN
  Administered 2021-10-28 (×2): 2 mg via INTRAVENOUS
  Filled 2021-10-27: qty 2

## 2021-10-27 MED ORDER — LACTATED RINGERS IV BOLUS
1000.0000 mL | Freq: Once | INTRAVENOUS | Status: AC
  Administered 2021-10-27: 1000 mL via INTRAVENOUS

## 2021-10-27 MED ORDER — PROPOFOL 1000 MG/100ML IV EMUL
INTRAVENOUS | Status: AC | PRN
  Administered 2021-10-27: 20 ug/kg/min via INTRAVENOUS

## 2021-10-27 MED ORDER — MIDAZOLAM HCL 2 MG/2ML IJ SOLN
INTRAMUSCULAR | Status: AC
  Administered 2021-10-27: 2 mg via INTRAVENOUS
  Filled 2021-10-27: qty 2

## 2021-10-27 MED ORDER — MIDAZOLAM HCL 2 MG/2ML IJ SOLN
INTRAMUSCULAR | Status: AC
  Filled 2021-10-27: qty 2

## 2021-10-27 MED ORDER — MIDAZOLAM HCL 2 MG/2ML IJ SOLN
2.0000 mg | INTRAMUSCULAR | Status: DC | PRN
  Filled 2021-10-27: qty 2

## 2021-10-27 MED ORDER — TIMOLOL MALEATE 0.5 % OP SOLN: 1.0000 [drp] | Freq: Once | OPHTHALMIC | Status: DC

## 2021-10-27 MED ORDER — TRANEXAMIC ACID-NACL 1000-0.7 MG/100ML-% IV SOLN
1000.0000 mg | Freq: Once | INTRAVENOUS | Status: AC
  Administered 2021-10-27: 1000 mg via INTRAVENOUS

## 2021-10-27 MED ORDER — ACETAZOLAMIDE SODIUM 500 MG IJ SOLR
500.0000 mg | Freq: Once | INTRAMUSCULAR | Status: AC
  Administered 2021-10-27: 500 mg via INTRAVENOUS
  Filled 2021-10-27: qty 500

## 2021-10-27 MED ORDER — MIDAZOLAM HCL 2 MG/2ML IJ SOLN
2.0000 mg | Freq: Once | INTRAMUSCULAR | Status: AC
  Administered 2021-10-27: 2 mg via INTRAVENOUS

## 2021-10-27 MED ORDER — IOHEXOL 300 MG/ML  SOLN
80.0000 mL | Freq: Once | INTRAMUSCULAR | Status: AC | PRN
  Administered 2021-10-27: 80 mL via INTRAVENOUS

## 2021-10-27 MED ORDER — ETOMIDATE 2 MG/ML IV SOLN
INTRAVENOUS | Status: AC | PRN
  Administered 2021-10-27: 20 mg via INTRAVENOUS

## 2021-10-27 MED ORDER — MIDAZOLAM HCL 2 MG/2ML IJ SOLN: 2.0000 mg | Freq: Once | INTRAMUSCULAR | Status: AC

## 2021-10-27 MED ORDER — PROPOFOL 1000 MG/100ML IV EMUL
0.0000 ug/kg/min | INTRAVENOUS | Status: DC
  Administered 2021-10-27: 30 ug/kg/min via INTRAVENOUS

## 2021-10-27 MED ORDER — FENTANYL CITRATE (PF) 100 MCG/2ML IJ SOLN
INTRAMUSCULAR | Status: AC
  Filled 2021-10-27: qty 2
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
## 2021-10-27 NOTE — ED Notes (Signed)
Pt to be intubated.

## 2021-10-27 NOTE — Progress Notes (Signed)
Orthopedic Tech Progress Note Patient Details:  Ryan Nielsen 05/24/1875 [id number]  Patient ID: Ryan Nielsen, male   DOB: 05/24/1875, 46 y.o.   MRN: [id number] Level 1 trauma. Al Decant 10/27/2021, 10:46 PM

## 2021-10-28 ENCOUNTER — Inpatient Hospital Stay (HOSPITAL_COMMUNITY): Payer: Self-pay

## 2021-10-28 DIAGNOSIS — R451 Restlessness and agitation: Secondary | ICD-10-CM | POA: Diagnosis present

## 2021-10-28 DIAGNOSIS — B9561 Methicillin susceptible Staphylococcus aureus infection as the cause of diseases classified elsewhere: Secondary | ICD-10-CM | POA: Diagnosis not present

## 2021-10-28 DIAGNOSIS — S0219XA Other fracture of base of skull, initial encounter for closed fracture: Secondary | ICD-10-CM | POA: Diagnosis present

## 2021-10-28 DIAGNOSIS — R402362 Coma scale, best motor response, obeys commands, at arrival to emergency department: Secondary | ICD-10-CM | POA: Diagnosis present

## 2021-10-28 DIAGNOSIS — H40051 Ocular hypertension, right eye: Secondary | ICD-10-CM | POA: Diagnosis present

## 2021-10-28 DIAGNOSIS — J9601 Acute respiratory failure with hypoxia: Secondary | ICD-10-CM | POA: Diagnosis present

## 2021-10-28 DIAGNOSIS — S02121A Fracture of orbital roof, right side, initial encounter for closed fracture: Secondary | ICD-10-CM | POA: Diagnosis present

## 2021-10-28 DIAGNOSIS — E876 Hypokalemia: Secondary | ICD-10-CM | POA: Diagnosis not present

## 2021-10-28 DIAGNOSIS — J14 Pneumonia due to Hemophilus influenzae: Secondary | ICD-10-CM | POA: Diagnosis not present

## 2021-10-28 DIAGNOSIS — Z20822 Contact with and (suspected) exposure to covid-19: Secondary | ICD-10-CM | POA: Diagnosis present

## 2021-10-28 DIAGNOSIS — R402132 Coma scale, eyes open, to sound, at arrival to emergency department: Secondary | ICD-10-CM | POA: Diagnosis present

## 2021-10-28 DIAGNOSIS — H73892 Other specified disorders of tympanic membrane, left ear: Secondary | ICD-10-CM | POA: Diagnosis present

## 2021-10-28 DIAGNOSIS — I609 Nontraumatic subarachnoid hemorrhage, unspecified: Secondary | ICD-10-CM | POA: Diagnosis present

## 2021-10-28 DIAGNOSIS — Z781 Physical restraint status: Secondary | ICD-10-CM | POA: Diagnosis not present

## 2021-10-28 DIAGNOSIS — S066XAA Traumatic subarachnoid hemorrhage with loss of consciousness status unknown, initial encounter: Secondary | ICD-10-CM | POA: Diagnosis present

## 2021-10-28 DIAGNOSIS — R402242 Coma scale, best verbal response, confused conversation, at arrival to emergency department: Secondary | ICD-10-CM | POA: Diagnosis present

## 2021-10-28 DIAGNOSIS — S065XAA Traumatic subdural hemorrhage with loss of consciousness status unknown, initial encounter: Secondary | ICD-10-CM | POA: Diagnosis present

## 2021-10-28 LAB — POCT I-STAT 7, (LYTES, BLD GAS, ICA,H+H)
Acid-Base Excess: 2 mmol/L (ref 0.0–2.0)
Acid-base deficit: 4 mmol/L — ABNORMAL HIGH (ref 0.0–2.0)
Bicarbonate: 20.9 mmol/L (ref 20.0–28.0)
Bicarbonate: 25.6 mmol/L (ref 20.0–28.0)
Calcium, Ion: 1.26 mmol/L (ref 1.15–1.40)
Calcium, Ion: 1.15 mmol/L (ref 1.15–1.40)
HCT: 44 % (ref 39.0–52.0)
HCT: 43 % (ref 39.0–52.0)
Hemoglobin: 15 g/dL (ref 13.0–17.0)
Hemoglobin: 14.6 g/dL (ref 13.0–17.0)
O2 Saturation: 99 %
O2 Saturation: 100 %
Patient temperature: 99.3
Patient temperature: 97.1
Potassium: 3.6 mmol/L (ref 3.5–5.1)
Potassium: 3 mmol/L — ABNORMAL LOW (ref 3.5–5.1)
Sodium: 137 mmol/L (ref 135–145)
Sodium: 138 mmol/L (ref 135–145)
TCO2: 22 mmol/L (ref 22–32)
TCO2: 27 mmol/L (ref 22–32)
pCO2 arterial: 38.3 mmHg (ref 32–48)
pCO2 arterial: 36.1 mmHg (ref 32–48)
pH, Arterial: 7.346 — ABNORMAL LOW (ref 7.35–7.45)
pH, Arterial: 7.456 — ABNORMAL HIGH (ref 7.35–7.45)
pO2, Arterial: 143 mmHg — ABNORMAL HIGH (ref 83–108)
pO2, Arterial: 269 mmHg — ABNORMAL HIGH (ref 83–108)

## 2021-10-28 LAB — BASIC METABOLIC PANEL
Anion gap: 10 (ref 5–15)
BUN: 13 mg/dL (ref 6–20)
CO2: 21 mmol/L — ABNORMAL LOW (ref 22–32)
Calcium: 8.7 mg/dL — ABNORMAL LOW (ref 8.9–10.3)
Chloride: 104 mmol/L (ref 98–111)
Creatinine, Ser: 1.02 mg/dL (ref 0.61–1.24)
GFR, Estimated: >60 mL/min (ref >60)
Glucose, Bld: 123 mg/dL — ABNORMAL HIGH (ref 70–99)
Potassium: 3.6 mmol/L (ref 3.5–5.1)
Sodium: 135 mmol/L (ref 135–145)

## 2021-10-28 LAB — HIV ANTIBODY (ROUTINE TESTING W REFLEX): HIV Screen 4th Generation wRfx: NONREACTIVE

## 2021-10-28 LAB — CBC
HCT: 45.2 % (ref 39.0–52.0)
Hemoglobin: 14.7 g/dL (ref 13.0–17.0)
MCH: 32.7 pg (ref 26.0–34.0)
MCHC: 32.5 g/dL (ref 30.0–36.0)
MCV: 100.7 fL — ABNORMAL HIGH (ref 80.0–100.0)
Platelets: 286 10*3/uL (ref 150–400)
RBC: 4.49 MIL/uL (ref 4.22–5.81)
RDW: 12.8 % (ref 11.5–15.5)
WBC: 19.4 10*3/uL — ABNORMAL HIGH (ref 4.0–10.5)
nRBC: 0 % (ref 0.0–0.2)

## 2021-10-28 LAB — MRSA NEXT GEN BY PCR, NASAL: MRSA by PCR Next Gen: NOT DETECTED

## 2021-10-28 LAB — RESP PANEL BY RT-PCR (FLU A&B, COVID) ARPGX2
Influenza A by PCR: NEGATIVE
Influenza B by PCR: NEGATIVE
SARS Coronavirus 2 by RT PCR: NEGATIVE

## 2021-10-28 LAB — TRIGLYCERIDES: Triglycerides: 175 mg/dL — ABNORMAL HIGH (ref <150)

## 2021-10-28 LAB — SODIUM
Sodium: 133 mmol/L — ABNORMAL LOW (ref 135–145)
Sodium: 140 mmol/L (ref 135–145)

## 2021-10-28 LAB — GLUCOSE, CAPILLARY
Glucose-Capillary: 143 mg/dL — ABNORMAL HIGH (ref 70–99)
Glucose-Capillary: 136 mg/dL — ABNORMAL HIGH (ref 70–99)
Glucose-Capillary: 115 mg/dL — ABNORMAL HIGH (ref 70–99)

## 2021-10-28 MED ORDER — PIVOT 1.5 CAL PO LIQD
1000.0000 mL | ORAL | Status: DC
  Administered 2021-10-28 – 2021-11-11 (×12): 1000 mL

## 2021-10-28 MED ORDER — FENTANYL 2500MCG IN NS 250ML (10MCG/ML) PREMIX INFUSION
50.0000 ug/h | INTRAVENOUS | Status: DC
  Administered 2021-10-28 – 2021-11-02 (×6): 100 ug/h via INTRAVENOUS
  Administered 2021-11-03: 75 ug/h via INTRAVENOUS
  Administered 2021-11-04: 100 ug/h via INTRAVENOUS
  Administered 2021-11-05: 75 ug/h via INTRAVENOUS
  Administered 2021-11-06 – 2021-11-07 (×2): 50 ug/h via INTRAVENOUS
  Administered 2021-11-09 – 2021-11-10 (×2): 100 ug/h via INTRAVENOUS
  Filled 2021-10-28 (×14): qty 250

## 2021-10-28 MED ORDER — ETOMIDATE 2 MG/ML IV SOLN: 20.0000 mg | Freq: Once | INTRAVENOUS | Status: AC

## 2021-10-28 MED ORDER — ONDANSETRON HCL 4 MG/2ML IJ SOLN
4.0000 mg | Freq: Four times a day (QID) | INTRAMUSCULAR | Status: DC | PRN
  Administered 2021-10-28 – 2021-11-11 (×2): 4 mg via INTRAVENOUS
  Filled 2021-10-28 (×2): qty 2

## 2021-10-28 MED ORDER — OXYCODONE HCL 5 MG/5ML PO SOLN
5.0000 mg | ORAL | Status: DC | PRN
  Administered 2021-10-31 – 2021-11-03 (×5): 10 mg
  Administered 2021-11-04: 5 mg
  Administered 2021-11-04 – 2021-11-10 (×6): 10 mg
  Filled 2021-10-28 (×12): qty 10

## 2021-10-28 MED ORDER — ACETAMINOPHEN 500 MG PO TABS
1000.0000 mg | ORAL_TABLET | Freq: Four times a day (QID) | ORAL | Status: DC
  Administered 2021-10-28 – 2021-11-12 (×55): 1000 mg
  Filled 2021-10-28 (×58): qty 2

## 2021-10-28 MED ORDER — SODIUM CHLORIDE 0.9 % IV SOLN: INTRAVENOUS | Status: DC

## 2021-10-28 MED ORDER — CHLORHEXIDINE GLUCONATE CLOTH 2 % EX PADS
6.0000 | MEDICATED_PAD | Freq: Every day | CUTANEOUS | Status: DC
  Administered 2021-10-28 – 2021-11-15 (×19): 6 via TOPICAL

## 2021-10-28 MED ORDER — CHLORHEXIDINE GLUCONATE 0.12% ORAL RINSE (MEDLINE KIT)
15.0000 mL | Freq: Two times a day (BID) | OROMUCOSAL | Status: DC
Start: 2021-10-28 — End: 2021-11-14
  Administered 2021-10-28 – 2021-11-13 (×33): 15 mL via OROMUCOSAL

## 2021-10-28 MED ORDER — CIPROFLOXACIN-DEXAMETHASONE 0.3-0.1 % OT SUSP
2.0000 [drp] | Freq: Two times a day (BID) | OTIC | Status: AC
  Administered 2021-10-28 – 2021-11-04 (×14): 2 [drp] via OTIC
  Filled 2021-10-28 (×2): qty 7.5

## 2021-10-28 MED ORDER — SODIUM CHLORIDE 3 % IV BOLUS
250.0000 mL | Freq: Once | INTRAVENOUS | Status: AC
  Administered 2021-10-28: 250 mL via INTRAVENOUS
  Filled 2021-10-28: qty 500

## 2021-10-28 MED ORDER — IOHEXOL 350 MG/ML SOLN
60.0000 mL | Freq: Once | INTRAVENOUS | Status: AC | PRN
  Administered 2021-10-28: 60 mL via INTRAVENOUS

## 2021-10-28 MED ORDER — PROPOFOL 1000 MG/100ML IV EMUL
5.0000 ug/kg/min | INTRAVENOUS | Status: DC
  Administered 2021-10-28: 40 ug/kg/min via INTRAVENOUS
  Administered 2021-10-28 – 2021-11-01 (×24): 50 ug/kg/min via INTRAVENOUS
  Administered 2021-11-02 (×2): 40 ug/kg/min via INTRAVENOUS
  Administered 2021-11-02: 30 ug/kg/min via INTRAVENOUS
  Administered 2021-11-02: 50 ug/kg/min via INTRAVENOUS
  Administered 2021-11-03: 40 ug/kg/min via INTRAVENOUS
  Administered 2021-11-03: 55 ug/kg/min via INTRAVENOUS
  Administered 2021-11-03: 50 ug/kg/min via INTRAVENOUS
  Administered 2021-11-03: 30 ug/kg/min via INTRAVENOUS
  Administered 2021-11-04: 60 ug/kg/min via INTRAVENOUS
  Administered 2021-11-04: 40 ug/kg/min via INTRAVENOUS
  Administered 2021-11-04: 45 ug/kg/min via INTRAVENOUS
  Administered 2021-11-04 – 2021-11-05 (×3): 40 ug/kg/min via INTRAVENOUS
  Administered 2021-11-05: 35 ug/kg/min via INTRAVENOUS
  Administered 2021-11-06 (×3): 40 ug/kg/min via INTRAVENOUS
  Administered 2021-11-07 (×3): 50 ug/kg/min via INTRAVENOUS
  Administered 2021-11-07: 40 ug/kg/min via INTRAVENOUS
  Administered 2021-11-07: 45 ug/kg/min via INTRAVENOUS
  Administered 2021-11-08 (×2): 40 ug/kg/min via INTRAVENOUS
  Filled 2021-10-28 (×44): qty 100
  Filled 2021-10-28: qty 200
  Filled 2021-10-28 (×7): qty 100
  Filled 2021-10-28: qty 200

## 2021-10-28 MED ORDER — ETOMIDATE 2 MG/ML IV SOLN
INTRAVENOUS | Status: AC
  Administered 2021-10-28: 20 mg via INTRAVENOUS
  Filled 2021-10-28: qty 10

## 2021-10-28 MED ORDER — ADULT MULTIVITAMIN W/MINERALS CH
1.0000 | ORAL_TABLET | Freq: Every day | ORAL | Status: DC
  Administered 2021-10-28 – 2021-11-11 (×15): 1
  Filled 2021-10-28 (×15): qty 1

## 2021-10-28 MED ORDER — METHOCARBAMOL 500 MG PO TABS
1000.0000 mg | ORAL_TABLET | Freq: Three times a day (TID) | ORAL | Status: DC
  Administered 2021-10-28 – 2021-11-12 (×45): 1000 mg
  Filled 2021-10-28 (×46): qty 2

## 2021-10-28 MED ORDER — SUCCINYLCHOLINE CHLORIDE 200 MG/10ML IV SOSY
PREFILLED_SYRINGE | INTRAVENOUS | Status: AC
  Administered 2021-10-28: 100 mg via INTRAVENOUS
  Filled 2021-10-28: qty 10

## 2021-10-28 MED ORDER — DOCUSATE SODIUM 100 MG PO CAPS: 100.0000 mg | ORAL_CAPSULE | Freq: Two times a day (BID) | ORAL | Status: DC | PRN

## 2021-10-28 MED ORDER — VITAL HIGH PROTEIN PO LIQD: 1000.0000 mL | ORAL | Status: DC

## 2021-10-28 MED ORDER — PANTOPRAZOLE 2 MG/ML SUSPENSION
40.0000 mg | Freq: Every day | ORAL | Status: DC
  Administered 2021-10-28 – 2021-11-11 (×15): 40 mg
  Filled 2021-10-28 (×15): qty 20

## 2021-10-28 MED ORDER — DEXTROSE-NACL 5-0.9 % IV SOLN: INTRAVENOUS | Status: DC

## 2021-10-28 MED ORDER — FENTANYL BOLUS VIA INFUSION
50.0000 ug | INTRAVENOUS | Status: DC | PRN
  Administered 2021-10-29 – 2021-11-11 (×13): 50 ug via INTRAVENOUS

## 2021-10-28 MED ORDER — FENTANYL CITRATE PF 50 MCG/ML IJ SOSY
50.0000 ug | PREFILLED_SYRINGE | Freq: Once | INTRAMUSCULAR | Status: AC
  Administered 2021-10-28: 50 ug via INTRAVENOUS

## 2021-10-28 MED ORDER — DOCUSATE SODIUM 50 MG/5ML PO LIQD
100.0000 mg | Freq: Two times a day (BID) | ORAL | Status: DC
  Administered 2021-10-28 – 2021-11-11 (×21): 100 mg
  Filled 2021-10-28 (×19): qty 10

## 2021-10-28 MED ORDER — CHLORHEXIDINE GLUCONATE 0.12% ORAL RINSE (MEDLINE KIT)
15.0000 mL | Freq: Two times a day (BID) | OROMUCOSAL | Status: DC
Start: 2021-10-28 — End: 2021-10-28
  Administered 2021-10-28: 15 mL via OROMUCOSAL

## 2021-10-28 MED ORDER — ORAL CARE MOUTH RINSE
15.0000 mL | OROMUCOSAL | Status: DC
  Administered 2021-10-28 (×3): 15 mL via OROMUCOSAL

## 2021-10-28 MED ORDER — SODIUM CHLORIDE 3 % IV SOLN
INTRAVENOUS | Status: DC
  Administered 2021-10-29: 75 mL/h via INTRAVENOUS
  Filled 2021-10-28 (×7): qty 500

## 2021-10-28 MED ORDER — LEVETIRACETAM IN NACL 500 MG/100ML IV SOLN
500.0000 mg | Freq: Two times a day (BID) | INTRAVENOUS | Status: DC
  Administered 2021-10-28 – 2021-10-30 (×5): 500 mg via INTRAVENOUS
  Filled 2021-10-28 (×5): qty 100

## 2021-10-28 MED ORDER — PROSOURCE TF PO LIQD
45.0000 mL | Freq: Two times a day (BID) | ORAL | Status: DC
  Administered 2021-10-28: 45 mL
  Filled 2021-10-28: qty 45

## 2021-10-28 MED ORDER — BRIMONIDINE TARTRATE 0.2 % OP SOLN
1.0000 [drp] | Freq: Three times a day (TID) | OPHTHALMIC | Status: DC
  Administered 2021-10-28 – 2021-11-26 (×82): 1 [drp] via OPHTHALMIC
  Filled 2021-10-28: qty 5

## 2021-10-28 MED ORDER — SUCCINYLCHOLINE CHLORIDE 200 MG/10ML IV SOSY: 100.0000 mg | PREFILLED_SYRINGE | Freq: Once | INTRAVENOUS | Status: AC

## 2021-10-28 MED ORDER — ORAL CARE MOUTH RINSE
15.0000 mL | OROMUCOSAL | Status: DC
  Administered 2021-10-28 – 2021-11-14 (×176): 15 mL via OROMUCOSAL
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
## 2021-10-28 NOTE — TOC CAGE-AID Note (Signed)
Transition of Care [facility]) - CAGE-AID Screening   Patient Details  Name: Ryan Nielsen MRN: [id number] Date of Birth: 15-Jun-1975  Transition of Care [facility]) CM/SW Contact:    [name], LCSWA Phone Number: 10/28/2021, 12:57 PM   Clinical Narrative: Pt is unable to participate in Cage Aid.  Pt is on full vent.  [name], MSW, LCSW-A Pronouns:  She/Her/Hers [facility] Health[facility]itions of Care Clinical Social Worker Direct Number:  [phone] [name]  CAGE-AID Screening: Substance Abuse Screening unable to be completed due to: : Patient unable to participate             Substance Abuse Education Offered: No
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
## 2021-10-28 NOTE — Progress Notes (Signed)
RT assisted with transport of this patient from 4N 23 to CT and back while on full ventilatory support. Patient tolerated well with svs and no complications. RN currently at bedside. RT will continue to monitor patient.

## 2021-10-28 NOTE — Progress Notes (Signed)
Spoke with pharmacy about tranexamic acid which was due at 2330 on 6/6.   Pt admitted to 4N23 ~ 0200. Pharmacy stated okay to administer at this time.

## 2021-10-28 NOTE — ED Notes (Signed)
Valuables locked up with security total of $685.26, keys, cell phone, apple watch and pocket knife

## 2021-10-28 NOTE — Progress Notes (Signed)
RT called to patient's room due to patient self extubating.  Upon arrival, patient's vitals had noted to be within normal limits however patient was noted to not be responsive and slightly combative.  Patient was reintubated with an 8.0 ETT, by Dr. Bobbye Morton, without complications.
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
## 2021-10-28 NOTE — Progress Notes (Signed)
SLP Cancellation Note  Patient Details Name: Ryan Nielsen MRN: [id number] DOB: 08-28-1975   Cancelled treatment:       Reason Eval/Treat Not Completed: Patient not medically ready (SLP orders received; pt self-extubated and re-intubated thereafter. SLP will follow closely and complete evaluations as pt is medically ready.)  Kenni Newton I. Vear Clock, MS, CCC-SLP Acute [facility] Services Office number [phone] Pager [phone]  Scheryl Marten 10/28/2021, 11:04 AM

## 2021-10-28 NOTE — Procedures (Signed)
Intubation Procedure Note  Lamine Laton  [id number]  1976/02/24  Date:10/28/21  Time:9:16 AM   Provider Performing: Diamantina Monks    Procedure: Intubation (31500)  Indication(s) Respiratory Failure  Consent Unable to obtain consent due to emergent nature of procedure.   Anesthesia Etomidate and Succinylcholine   Time Out Verified patient identification, verified procedure, site/side was marked, verified correct patient position, special equipment/implants available, medications/allergies/relevant history reviewed, required imaging and test results available.   Sterile Technique Usual hand hygeine, masks, and gloves were used   Procedure Description Patient positioned in bed supine.  Sedation given as noted above.  Patient was intubated with endotracheal tube using Glidescope.  View was Grade 1 full glottis .  Number of attempts was 1.  Colorimetric CO2 detector was consistent with tracheal placement.   Complications/Tolerance None; patient tolerated the procedure well. Chest X-ray is ordered to verify placement.   EBL none   Specimen(s) None

## 2021-10-28 NOTE — Consult Note (Incomplete)
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 the sphenoid and left maxillary sinuses. Soft tissues: Soft tissue swelling is greatest in the right periorbital space. CT CERVICAL SPINE FINDINGS Alignment: No static subluxation. Facets are aligned. Occipital condyles and the lateral masses of C1-C2 are aligned. Vertebrae: No acute fracture. Soft tissues and spinal canal: No prevertebral fluid or swelling. No visible canal hematoma. Disc levels: No advanced spinal canal or neural foraminal stenosis. Upper chest: No pneumothorax, pulmonary nodule or pleural effusion. Other: Normal visualized paraspinal cervical soft tissues. IMPRESSION: 1. Small hemorrhagic contusions of the anterior left temporal lobe and both frontal poles. 2. Mixed subdural and subarachnoid blood over both anterior hemispheres and the lateral left hemisphere. No midline shift or other mass effect. 3. Otic capsule sparing complex left temporal  bone fracture violating the mastoid air cells and causing diastasis of the left limb of the lambdoid suture. 4. Minimally displaced fracture of the right orbital roof with a small amounts of superior extraconal gas in the right orbit. 5. No acute fracture or static subluxation of the cervical spine. Emergent findings were discussed with Dr. Posey Rea by Dr. Ladona Ridgel at 11:23 p.m. on 10/27/2021. Electronically Signed   By: Deatra Robinson M.D.   On: 10/28/2021 00:00    Review of Systems Blood pressure (!) 150/82, pulse 68, temperature 99.3 F (37.4 C), temperature source Axillary, resp. rate 18, height 5\' 8"  (1.727 m), weight 68 kg, SpO2 100 %. Physical Exam  Assessment/Plan: ***  , DNP, AGNP-C Neurosurgery Nurse Practitioner  [facility] [address], [address], [place], [place] P: [phone]    F: [phone]  10/28/2021 7:18 AM

## 2021-10-28 NOTE — Progress Notes (Signed)
Reviewed CT head.  There is expected blooming of subfrontal contusions and left temporal contusions, small amount of IVH.  Cisterns appear patent. No significant midline shift.  On exam, he briskly and purposefully localizes, with eyes open to stim, may have some aphasia component from his left temporal lobe contusions, left facial nerve injury.  To help with peri-contusional swelling, aiming for Na+ level in 145-155 range  with 3% saline over the next 3 days.  He will need a repeat CT head tomorrow to determine if safe to start lovenox.   Keppra 500 x 7 days.

## 2021-10-29 ENCOUNTER — Inpatient Hospital Stay (HOSPITAL_COMMUNITY): Payer: Self-pay

## 2021-10-29 LAB — BASIC METABOLIC PANEL
Anion gap: 4 — ABNORMAL LOW (ref 5–15)
BUN: 7 mg/dL (ref 6–20)
CO2: 19 mmol/L — ABNORMAL LOW (ref 22–32)
Calcium: 7.9 mg/dL — ABNORMAL LOW (ref 8.9–10.3)
Chloride: 118 mmol/L — ABNORMAL HIGH (ref 98–111)
Creatinine, Ser: 0.96 mg/dL (ref 0.61–1.24)
GFR, Estimated: >60 mL/min (ref >60)
Glucose, Bld: 106 mg/dL — ABNORMAL HIGH (ref 70–99)
Potassium: 3.7 mmol/L (ref 3.5–5.1)
Sodium: 141 mmol/L (ref 135–145)

## 2021-10-29 LAB — POCT I-STAT 7, (LYTES, BLD GAS, ICA,H+H)
Acid-base deficit: 6 mmol/L — ABNORMAL HIGH (ref 0.0–2.0)
Bicarbonate: 19.3 mmol/L — ABNORMAL LOW (ref 20.0–28.0)
Calcium, Ion: 1.27 mmol/L (ref 1.15–1.40)
HCT: 33 % — ABNORMAL LOW (ref 39.0–52.0)
Hemoglobin: 11.2 g/dL — ABNORMAL LOW (ref 13.0–17.0)
O2 Saturation: 99 %
Patient temperature: 99.3
Potassium: 3.6 mmol/L (ref 3.5–5.1)
Sodium: 146 mmol/L — ABNORMAL HIGH (ref 135–145)
TCO2: 20 mmol/L — ABNORMAL LOW (ref 22–32)
pCO2 arterial: 37.4 mmHg (ref 32–48)
pH, Arterial: 7.322 — ABNORMAL LOW (ref 7.35–7.45)
pO2, Arterial: 135 mmHg — ABNORMAL HIGH (ref 83–108)

## 2021-10-29 LAB — GLUCOSE, CAPILLARY
Glucose-Capillary: 133 mg/dL — ABNORMAL HIGH (ref 70–99)
Glucose-Capillary: 102 mg/dL — ABNORMAL HIGH (ref 70–99)
Glucose-Capillary: 151 mg/dL — ABNORMAL HIGH (ref 70–99)
Glucose-Capillary: 108 mg/dL — ABNORMAL HIGH (ref 70–99)
Glucose-Capillary: 109 mg/dL — ABNORMAL HIGH (ref 70–99)
Glucose-Capillary: 140 mg/dL — ABNORMAL HIGH (ref 70–99)

## 2021-10-29 LAB — CBC
HCT: 36.1 % — ABNORMAL LOW (ref 39.0–52.0)
Hemoglobin: 11.8 g/dL — ABNORMAL LOW (ref 13.0–17.0)
MCH: 33 pg (ref 26.0–34.0)
MCHC: 32.7 g/dL (ref 30.0–36.0)
MCV: 100.8 fL — ABNORMAL HIGH (ref 80.0–100.0)
Platelets: 217 10*3/uL (ref 150–400)
RBC: 3.58 MIL/uL — ABNORMAL LOW (ref 4.22–5.81)
RDW: 13.2 % (ref 11.5–15.5)
WBC: 11 10*3/uL — ABNORMAL HIGH (ref 4.0–10.5)
nRBC: 0 % (ref 0.0–0.2)

## 2021-10-29 LAB — URINE CULTURE: Culture: 20000 — AB

## 2021-10-29 LAB — SODIUM
Sodium: 141 mmol/L (ref 135–145)
Sodium: 145 mmol/L (ref 135–145)
Sodium: 144 mmol/L (ref 135–145)

## 2021-10-29 MED ORDER — ENOXAPARIN SODIUM 30 MG/0.3ML IJ SOSY
30.0000 mg | PREFILLED_SYRINGE | Freq: Two times a day (BID) | INTRAMUSCULAR | Status: DC
  Administered 2021-10-29 – 2021-11-25 (×51): 30 mg via SUBCUTANEOUS
  Filled 2021-10-29 (×56): qty 0.3
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
## 2021-10-29 NOTE — Progress Notes (Signed)
Pt transported by transport, RT and RN from 4N23 to CT2 and back w/o complications

## 2021-10-30 ENCOUNTER — Inpatient Hospital Stay (HOSPITAL_COMMUNITY): Payer: Self-pay

## 2021-10-30 LAB — CBC
HCT: 34.2 % — ABNORMAL LOW (ref 39.0–52.0)
Hemoglobin: 11 g/dL — ABNORMAL LOW (ref 13.0–17.0)
MCH: 32.8 pg (ref 26.0–34.0)
MCHC: 32.2 g/dL (ref 30.0–36.0)
MCV: 102.1 fL — ABNORMAL HIGH (ref 80.0–100.0)
Platelets: 201 10*3/uL (ref 150–400)
RBC: 3.35 MIL/uL — ABNORMAL LOW (ref 4.22–5.81)
RDW: 13.7 % (ref 11.5–15.5)
WBC: 10.1 10*3/uL (ref 4.0–10.5)
nRBC: 0 % (ref 0.0–0.2)

## 2021-10-30 LAB — BLOOD GAS, ARTERIAL
Acid-base deficit: 3.3 mmol/L — ABNORMAL HIGH (ref 0.0–2.0)
Bicarbonate: 22.1 mmol/L (ref 20.0–28.0)
Drawn by: 38235
O2 Saturation: 99.6 %
Patient temperature: 37.5
pCO2 arterial: 41 mmHg (ref 32–48)
pH, Arterial: 7.34 — ABNORMAL LOW (ref 7.35–7.45)
pO2, Arterial: 137 mmHg — ABNORMAL HIGH (ref 83–108)

## 2021-10-30 LAB — BASIC METABOLIC PANEL
Anion gap: 5 (ref 5–15)
BUN: 8 mg/dL (ref 6–20)
CO2: 20 mmol/L — ABNORMAL LOW (ref 22–32)
Calcium: 7.7 mg/dL — ABNORMAL LOW (ref 8.9–10.3)
Chloride: 124 mmol/L — ABNORMAL HIGH (ref 98–111)
Creatinine, Ser: 0.75 mg/dL (ref 0.61–1.24)
GFR, Estimated: >60 mL/min (ref >60)
Glucose, Bld: 97 mg/dL (ref 70–99)
Potassium: 3.6 mmol/L (ref 3.5–5.1)
Sodium: 149 mmol/L — ABNORMAL HIGH (ref 135–145)

## 2021-10-30 LAB — GLUCOSE, CAPILLARY
Glucose-Capillary: 106 mg/dL — ABNORMAL HIGH (ref 70–99)
Glucose-Capillary: 118 mg/dL — ABNORMAL HIGH (ref 70–99)
Glucose-Capillary: 113 mg/dL — ABNORMAL HIGH (ref 70–99)
Glucose-Capillary: 96 mg/dL (ref 70–99)
Glucose-Capillary: 104 mg/dL — ABNORMAL HIGH (ref 70–99)
Glucose-Capillary: 130 mg/dL — ABNORMAL HIGH (ref 70–99)

## 2021-10-30 LAB — SODIUM
Sodium: 150 mmol/L — ABNORMAL HIGH (ref 135–145)
Sodium: 155 mmol/L — ABNORMAL HIGH (ref 135–145)
Sodium: 155 mmol/L — ABNORMAL HIGH (ref 135–145)

## 2021-10-30 MED ORDER — POLYETHYLENE GLYCOL 3350 17 G PO PACK
17.0000 g | PACK | Freq: Every day | ORAL | Status: DC
  Administered 2021-10-31 – 2021-11-01 (×2): 17 g
  Filled 2021-10-30 (×2): qty 1

## 2021-10-30 MED ORDER — LEVETIRACETAM 100 MG/ML PO SOLN
500.0000 mg | Freq: Two times a day (BID) | ORAL | Status: AC
  Administered 2021-10-30 – 2021-11-03 (×9): 500 mg
  Filled 2021-10-30 (×9): qty 5

## 2021-10-30 MED ORDER — POLYETHYLENE GLYCOL 3350 17 G PO PACK
17.0000 g | PACK | Freq: Every day | ORAL | Status: DC
Start: 2021-10-30 — End: 2021-10-30
  Administered 2021-10-30: 17 g via ORAL
  Filled 2021-10-30: qty 1

## 2021-10-30 MED ORDER — QUETIAPINE FUMARATE 25 MG PO TABS
50.0000 mg | ORAL_TABLET | Freq: Two times a day (BID) | ORAL | Status: DC
  Administered 2021-10-30 – 2021-11-04 (×12): 50 mg
  Filled 2021-10-30 (×12): qty 2

## 2021-10-30 NOTE — Progress Notes (Signed)
SLP Cancellation Note  Patient Details Name: Ryan Nielsen MRN: [id number] DOB: [birth year]   Cancelled treatment:       Reason Eval/Treat Not Completed: Patient not medically ready   Darel Ricketts, Katherene Ponto 10/30/2021, 8:14 AM
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
## 2021-10-30 NOTE — Care Management (Signed)
Found phone number for patient's aunt in old chart.  Left message for [facility], phone number [phone] to call [facility] ICU and ask for patient's nurse.    Quintella Baton, RN, BSN  Trauma/Neuro ICU Case Manager [phone]
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
## 2021-10-30 NOTE — Progress Notes (Signed)
Neurosurgery  Patient seen and examined.  Intubated Opens eyes to voice.  Briskly and purposefully localizing x 4, does not follow commands.  Left facial droop. Dried blood in left ear  Can wean 3% off starting tomorrow, keep normonatremic Lovenox for dvt prophylaxis Keppra x 4 more days

## 2021-10-31 LAB — BASIC METABOLIC PANEL
Anion gap: 6 (ref 5–15)
BUN: 10 mg/dL (ref 6–20)
CO2: 23 mmol/L (ref 22–32)
Calcium: 8.4 mg/dL — ABNORMAL LOW (ref 8.9–10.3)
Chloride: 124 mmol/L — ABNORMAL HIGH (ref 98–111)
Creatinine, Ser: 0.8 mg/dL (ref 0.61–1.24)
GFR, Estimated: >60 mL/min (ref >60)
Glucose, Bld: 146 mg/dL — ABNORMAL HIGH (ref 70–99)
Potassium: 3.3 mmol/L — ABNORMAL LOW (ref 3.5–5.1)
Sodium: 153 mmol/L — ABNORMAL HIGH (ref 135–145)

## 2021-10-31 LAB — GLUCOSE, CAPILLARY
Glucose-Capillary: 120 mg/dL — ABNORMAL HIGH (ref 70–99)
Glucose-Capillary: 137 mg/dL — ABNORMAL HIGH (ref 70–99)
Glucose-Capillary: 154 mg/dL — ABNORMAL HIGH (ref 70–99)
Glucose-Capillary: 160 mg/dL — ABNORMAL HIGH (ref 70–99)
Glucose-Capillary: 147 mg/dL — ABNORMAL HIGH (ref 70–99)

## 2021-10-31 LAB — CBC
HCT: 35.3 % — ABNORMAL LOW (ref 39.0–52.0)
Hemoglobin: 11 g/dL — ABNORMAL LOW (ref 13.0–17.0)
MCH: 31.6 pg (ref 26.0–34.0)
MCHC: 31.2 g/dL (ref 30.0–36.0)
MCV: 101.4 fL — ABNORMAL HIGH (ref 80.0–100.0)
Platelets: 242 10*3/uL (ref 150–400)
RBC: 3.48 MIL/uL — ABNORMAL LOW (ref 4.22–5.81)
RDW: 14.4 % (ref 11.5–15.5)
WBC: 10.3 10*3/uL (ref 4.0–10.5)
nRBC: 0 % (ref 0.0–0.2)

## 2021-10-31 LAB — TRIGLYCERIDES: Triglycerides: 318 mg/dL — ABNORMAL HIGH (ref <150)

## 2021-10-31 LAB — SODIUM
Sodium: 151 mmol/L — ABNORMAL HIGH (ref 135–145)
Sodium: 150 mmol/L — ABNORMAL HIGH (ref 135–145)
Sodium: 150 mmol/L — ABNORMAL HIGH (ref 135–145)

## 2021-10-31 MED ORDER — POTASSIUM CHLORIDE 20 MEQ PO PACK
40.0000 meq | PACK | Freq: Once | ORAL | Status: AC
Start: 2021-10-31 — End: 2021-10-31
  Administered 2021-10-31: 40 meq

## 2021-10-31 MED ORDER — MAGNESIUM HYDROXIDE 400 MG/5ML PO SUSP
30.0000 mL | Freq: Once | ORAL | Status: AC
  Administered 2021-10-31: 30 mL

## 2021-10-31 MED ORDER — POTASSIUM CHLORIDE 20 MEQ PO PACK
40.0000 meq | PACK | Freq: Once | ORAL | Status: DC
  Filled 2021-10-31: qty 2

## 2021-10-31 MED ORDER — MAGNESIUM HYDROXIDE 400 MG/5ML PO SUSP
30.0000 mL | Freq: Once | ORAL | Status: DC
  Filled 2021-10-31: qty 30
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
## 2021-11-01 LAB — BASIC METABOLIC PANEL
Anion gap: 7 (ref 5–15)
BUN: 12 mg/dL (ref 6–20)
CO2: 25 mmol/L (ref 22–32)
Calcium: 8.3 mg/dL — ABNORMAL LOW (ref 8.9–10.3)
Chloride: 116 mmol/L — ABNORMAL HIGH (ref 98–111)
Creatinine, Ser: 0.84 mg/dL (ref 0.61–1.24)
GFR, Estimated: >60 mL/min (ref >60)
Glucose, Bld: 113 mg/dL — ABNORMAL HIGH (ref 70–99)
Potassium: 3.2 mmol/L — ABNORMAL LOW (ref 3.5–5.1)
Sodium: 148 mmol/L — ABNORMAL HIGH (ref 135–145)

## 2021-11-01 LAB — SODIUM
Sodium: 148 mmol/L — ABNORMAL HIGH (ref 135–145)
Sodium: 148 mmol/L — ABNORMAL HIGH (ref 135–145)
Sodium: 150 mmol/L — ABNORMAL HIGH (ref 135–145)

## 2021-11-01 LAB — CBC
HCT: 35.6 % — ABNORMAL LOW (ref 39.0–52.0)
Hemoglobin: 11.2 g/dL — ABNORMAL LOW (ref 13.0–17.0)
MCH: 31.4 pg (ref 26.0–34.0)
MCHC: 31.5 g/dL (ref 30.0–36.0)
MCV: 99.7 fL (ref 80.0–100.0)
Platelets: 281 10*3/uL (ref 150–400)
RBC: 3.57 MIL/uL — ABNORMAL LOW (ref 4.22–5.81)
RDW: 14.4 % (ref 11.5–15.5)
WBC: 12.7 10*3/uL — ABNORMAL HIGH (ref 4.0–10.5)
nRBC: 0 % (ref 0.0–0.2)

## 2021-11-01 LAB — GLUCOSE, CAPILLARY
Glucose-Capillary: 112 mg/dL — ABNORMAL HIGH (ref 70–99)
Glucose-Capillary: 136 mg/dL — ABNORMAL HIGH (ref 70–99)
Glucose-Capillary: 137 mg/dL — ABNORMAL HIGH (ref 70–99)
Glucose-Capillary: 139 mg/dL — ABNORMAL HIGH (ref 70–99)
Glucose-Capillary: 144 mg/dL — ABNORMAL HIGH (ref 70–99)
Glucose-Capillary: 151 mg/dL — ABNORMAL HIGH (ref 70–99)
Glucose-Capillary: 160 mg/dL — ABNORMAL HIGH (ref 70–99)

## 2021-11-01 MED ORDER — POTASSIUM CHLORIDE 20 MEQ PO PACK
40.0000 meq | PACK | Freq: Once | ORAL | Status: AC
  Administered 2021-11-01: 40 meq
  Filled 2021-11-01: qty 2

## 2021-11-01 MED ORDER — MAGNESIUM HYDROXIDE 400 MG/5ML PO SUSP
30.0000 mL | Freq: Once | ORAL | Status: AC
  Administered 2021-11-01: 30 mL
  Filled 2021-11-01: qty 30
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
## 2021-11-02 LAB — BASIC METABOLIC PANEL
Anion gap: 9 (ref 5–15)
BUN: 16 mg/dL (ref 6–20)
CO2: 26 mmol/L (ref 22–32)
Calcium: 8.4 mg/dL — ABNORMAL LOW (ref 8.9–10.3)
Chloride: 112 mmol/L — ABNORMAL HIGH (ref 98–111)
Creatinine, Ser: 0.89 mg/dL (ref 0.61–1.24)
GFR, Estimated: >60 mL/min (ref >60)
Glucose, Bld: 151 mg/dL — ABNORMAL HIGH (ref 70–99)
Potassium: 3.7 mmol/L (ref 3.5–5.1)
Sodium: 147 mmol/L — ABNORMAL HIGH (ref 135–145)

## 2021-11-02 LAB — GLUCOSE, CAPILLARY
Glucose-Capillary: 123 mg/dL — ABNORMAL HIGH (ref 70–99)
Glucose-Capillary: 133 mg/dL — ABNORMAL HIGH (ref 70–99)
Glucose-Capillary: 141 mg/dL — ABNORMAL HIGH (ref 70–99)
Glucose-Capillary: 146 mg/dL — ABNORMAL HIGH (ref 70–99)
Glucose-Capillary: 147 mg/dL — ABNORMAL HIGH (ref 70–99)
Glucose-Capillary: 167 mg/dL — ABNORMAL HIGH (ref 70–99)

## 2021-11-02 LAB — CBC
HCT: 36.6 % — ABNORMAL LOW (ref 39.0–52.0)
Hemoglobin: 12 g/dL — ABNORMAL LOW (ref 13.0–17.0)
MCH: 32.3 pg (ref 26.0–34.0)
MCHC: 32.8 g/dL (ref 30.0–36.0)
MCV: 98.7 fL (ref 80.0–100.0)
Platelets: 303 10*3/uL (ref 150–400)
RBC: 3.71 MIL/uL — ABNORMAL LOW (ref 4.22–5.81)
RDW: 14.3 % (ref 11.5–15.5)
WBC: 12.5 10*3/uL — ABNORMAL HIGH (ref 4.0–10.5)
nRBC: 0 % (ref 0.0–0.2)

## 2021-11-02 LAB — MAGNESIUM: Magnesium: 2.4 mg/dL (ref 1.7–2.4)
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
## 2021-11-03 ENCOUNTER — Inpatient Hospital Stay (HOSPITAL_COMMUNITY): Payer: Self-pay

## 2021-11-03 LAB — CBC
HCT: 37.5 % — ABNORMAL LOW (ref 39.0–52.0)
Hemoglobin: 12 g/dL — ABNORMAL LOW (ref 13.0–17.0)
MCH: 31.7 pg (ref 26.0–34.0)
MCHC: 32 g/dL (ref 30.0–36.0)
MCV: 99.2 fL (ref 80.0–100.0)
Platelets: 333 10*3/uL (ref 150–400)
RBC: 3.78 MIL/uL — ABNORMAL LOW (ref 4.22–5.81)
RDW: 14.2 % (ref 11.5–15.5)
WBC: 12.7 10*3/uL — ABNORMAL HIGH (ref 4.0–10.5)
nRBC: 0 % (ref 0.0–0.2)

## 2021-11-03 LAB — POCT I-STAT 7, (LYTES, BLD GAS, ICA,H+H)
Acid-Base Excess: 2 mmol/L (ref 0.0–2.0)
Bicarbonate: 27 mmol/L (ref 20.0–28.0)
Calcium, Ion: 1.2 mmol/L (ref 1.15–1.40)
HCT: 34 % — ABNORMAL LOW (ref 39.0–52.0)
Hemoglobin: 11.6 g/dL — ABNORMAL LOW (ref 13.0–17.0)
O2 Saturation: 95 %
Patient temperature: 100.4
Potassium: 3.4 mmol/L — ABNORMAL LOW (ref 3.5–5.1)
Sodium: 145 mmol/L (ref 135–145)
TCO2: 28 mmol/L (ref 22–32)
pCO2 arterial: 43.3 mmHg (ref 32–48)
pH, Arterial: 7.407 (ref 7.35–7.45)
pO2, Arterial: 78 mmHg — ABNORMAL LOW (ref 83–108)

## 2021-11-03 LAB — BASIC METABOLIC PANEL
Anion gap: 8 (ref 5–15)
BUN: 16 mg/dL (ref 6–20)
CO2: 26 mmol/L (ref 22–32)
Calcium: 8.4 mg/dL — ABNORMAL LOW (ref 8.9–10.3)
Chloride: 110 mmol/L (ref 98–111)
Creatinine, Ser: 0.76 mg/dL (ref 0.61–1.24)
GFR, Estimated: >60 mL/min (ref >60)
Glucose, Bld: 128 mg/dL — ABNORMAL HIGH (ref 70–99)
Potassium: 3.5 mmol/L (ref 3.5–5.1)
Sodium: 144 mmol/L (ref 135–145)

## 2021-11-03 LAB — GLUCOSE, CAPILLARY
Glucose-Capillary: 131 mg/dL — ABNORMAL HIGH (ref 70–99)
Glucose-Capillary: 122 mg/dL — ABNORMAL HIGH (ref 70–99)
Glucose-Capillary: 133 mg/dL — ABNORMAL HIGH (ref 70–99)
Glucose-Capillary: 112 mg/dL — ABNORMAL HIGH (ref 70–99)
Glucose-Capillary: 119 mg/dL — ABNORMAL HIGH (ref 70–99)
Glucose-Capillary: 123 mg/dL — ABNORMAL HIGH (ref 70–99)

## 2021-11-03 LAB — TRIGLYCERIDES: Triglycerides: 154 mg/dL — ABNORMAL HIGH (ref <150)

## 2021-11-03 MED ORDER — SODIUM CHLORIDE 0.9 % IV SOLN
2.0000 g | Freq: Three times a day (TID) | INTRAVENOUS | Status: DC
  Administered 2021-11-03 – 2021-11-06 (×10): 2 g via INTRAVENOUS
  Filled 2021-11-03 (×9): qty 12.5
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
## 2021-11-03 NOTE — Progress Notes (Signed)
Pt's cell phone given to Pt's next of kin, sister Gabriel Rung. All other belongings remain locked up in security.

## 2021-11-03 NOTE — Progress Notes (Signed)
SLP Cancellation Note  Patient Details Name: Ryan Nielsen MRN: [id number] DOB: 1976-02-28   Cancelled treatment:        received order for swallow eval. Pt currently on CPAP/PS. Will check on tomorrow.    Royce Macadamia 11/03/2021, 8:34 AM

## 2021-11-03 NOTE — Progress Notes (Signed)
RT NOTE: patient placed on CPAP/PSV of 10/5 at 0810.  Currently tolerating well.

## 2021-11-04 LAB — BASIC METABOLIC PANEL
Anion gap: 11 (ref 5–15)
BUN: 19 mg/dL (ref 6–20)
CO2: 24 mmol/L (ref 22–32)
Calcium: 8.4 mg/dL — ABNORMAL LOW (ref 8.9–10.3)
Chloride: 106 mmol/L (ref 98–111)
Creatinine, Ser: 0.76 mg/dL (ref 0.61–1.24)
GFR, Estimated: >60 mL/min (ref >60)
Glucose, Bld: 128 mg/dL — ABNORMAL HIGH (ref 70–99)
Potassium: 3.9 mmol/L (ref 3.5–5.1)
Sodium: 141 mmol/L (ref 135–145)

## 2021-11-04 LAB — CBC
HCT: 35.3 % — ABNORMAL LOW (ref 39.0–52.0)
Hemoglobin: 11.3 g/dL — ABNORMAL LOW (ref 13.0–17.0)
MCH: 31.7 pg (ref 26.0–34.0)
MCHC: 32 g/dL (ref 30.0–36.0)
MCV: 98.9 fL (ref 80.0–100.0)
Platelets: 357 10*3/uL (ref 150–400)
RBC: 3.57 MIL/uL — ABNORMAL LOW (ref 4.22–5.81)
RDW: 14 % (ref 11.5–15.5)
WBC: 14.2 10*3/uL — ABNORMAL HIGH (ref 4.0–10.5)
nRBC: 0 % (ref 0.0–0.2)

## 2021-11-04 LAB — GLUCOSE, CAPILLARY
Glucose-Capillary: 112 mg/dL — ABNORMAL HIGH (ref 70–99)
Glucose-Capillary: 120 mg/dL — ABNORMAL HIGH (ref 70–99)
Glucose-Capillary: 122 mg/dL — ABNORMAL HIGH (ref 70–99)
Glucose-Capillary: 123 mg/dL — ABNORMAL HIGH (ref 70–99)
Glucose-Capillary: 132 mg/dL — ABNORMAL HIGH (ref 70–99)
Glucose-Capillary: 144 mg/dL — ABNORMAL HIGH (ref 70–99)
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
## 2021-11-05 LAB — CBC
HCT: 34.7 % — ABNORMAL LOW (ref 39.0–52.0)
Hemoglobin: 11 g/dL — ABNORMAL LOW (ref 13.0–17.0)
MCH: 31.3 pg (ref 26.0–34.0)
MCHC: 31.7 g/dL (ref 30.0–36.0)
MCV: 98.9 fL (ref 80.0–100.0)
Platelets: 402 10*3/uL — ABNORMAL HIGH (ref 150–400)
RBC: 3.51 MIL/uL — ABNORMAL LOW (ref 4.22–5.81)
RDW: 13.6 % (ref 11.5–15.5)
WBC: 14.4 10*3/uL — ABNORMAL HIGH (ref 4.0–10.5)
nRBC: 0 % (ref 0.0–0.2)

## 2021-11-05 LAB — BASIC METABOLIC PANEL
Anion gap: 7 (ref 5–15)
BUN: 18 mg/dL (ref 6–20)
CO2: 26 mmol/L (ref 22–32)
Calcium: 8.1 mg/dL — ABNORMAL LOW (ref 8.9–10.3)
Chloride: 104 mmol/L (ref 98–111)
Creatinine, Ser: 0.7 mg/dL (ref 0.61–1.24)
GFR, Estimated: >60 mL/min (ref >60)
Glucose, Bld: 126 mg/dL — ABNORMAL HIGH (ref 70–99)
Potassium: 3.6 mmol/L (ref 3.5–5.1)
Sodium: 137 mmol/L (ref 135–145)

## 2021-11-05 LAB — GLUCOSE, CAPILLARY
Glucose-Capillary: 135 mg/dL — ABNORMAL HIGH (ref 70–99)
Glucose-Capillary: 132 mg/dL — ABNORMAL HIGH (ref 70–99)
Glucose-Capillary: 147 mg/dL — ABNORMAL HIGH (ref 70–99)
Glucose-Capillary: 96 mg/dL (ref 70–99)
Glucose-Capillary: 121 mg/dL — ABNORMAL HIGH (ref 70–99)
Glucose-Capillary: 123 mg/dL — ABNORMAL HIGH (ref 70–99)

## 2021-11-05 MED ORDER — CLONAZEPAM 0.5 MG PO TABS
0.5000 mg | ORAL_TABLET | Freq: Two times a day (BID) | ORAL | Status: DC
  Administered 2021-11-05 – 2021-11-09 (×10): 0.5 mg
  Filled 2021-11-05 (×10): qty 1

## 2021-11-05 MED ORDER — QUETIAPINE FUMARATE 100 MG PO TABS
100.0000 mg | ORAL_TABLET | Freq: Two times a day (BID) | ORAL | Status: DC
  Administered 2021-11-05 – 2021-11-09 (×10): 100 mg
  Filled 2021-11-05 (×10): qty 1

## 2021-11-05 NOTE — Progress Notes (Signed)
SLP Cancellation Note  Patient Details Name: Ryan Nielsen MRN: [id number] DOB: 1975-11-24   Cancelled treatment:       Reason Eval/Treat Not Completed: Patient not medically ready - remains on vent. Will f/u as able.     Mahala Menghini., M.A. CCC-SLP Acute Rehabilitation Services Office [phone]  Secure chat preferred  11/05/2021, 7:41 AM
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
## 2021-11-06 ENCOUNTER — Inpatient Hospital Stay (HOSPITAL_COMMUNITY): Payer: Self-pay

## 2021-11-06 LAB — BASIC METABOLIC PANEL
Anion gap: 5 (ref 5–15)
BUN: 19 mg/dL (ref 6–20)
CO2: 27 mmol/L (ref 22–32)
Calcium: 8.4 mg/dL — ABNORMAL LOW (ref 8.9–10.3)
Chloride: 107 mmol/L (ref 98–111)
Creatinine, Ser: 0.75 mg/dL (ref 0.61–1.24)
GFR, Estimated: >60 mL/min (ref >60)
Glucose, Bld: 102 mg/dL — ABNORMAL HIGH (ref 70–99)
Potassium: 3.8 mmol/L (ref 3.5–5.1)
Sodium: 139 mmol/L (ref 135–145)

## 2021-11-06 LAB — GLUCOSE, CAPILLARY
Glucose-Capillary: 90 mg/dL (ref 70–99)
Glucose-Capillary: 126 mg/dL — ABNORMAL HIGH (ref 70–99)
Glucose-Capillary: 106 mg/dL — ABNORMAL HIGH (ref 70–99)
Glucose-Capillary: 126 mg/dL — ABNORMAL HIGH (ref 70–99)
Glucose-Capillary: 113 mg/dL — ABNORMAL HIGH (ref 70–99)
Glucose-Capillary: 102 mg/dL — ABNORMAL HIGH (ref 70–99)

## 2021-11-06 LAB — CBC
HCT: 34.7 % — ABNORMAL LOW (ref 39.0–52.0)
Hemoglobin: 11.3 g/dL — ABNORMAL LOW (ref 13.0–17.0)
MCH: 31.7 pg (ref 26.0–34.0)
MCHC: 32.6 g/dL (ref 30.0–36.0)
MCV: 97.5 fL (ref 80.0–100.0)
Platelets: 466 10*3/uL — ABNORMAL HIGH (ref 150–400)
RBC: 3.56 MIL/uL — ABNORMAL LOW (ref 4.22–5.81)
RDW: 13.3 % (ref 11.5–15.5)
WBC: 14.7 10*3/uL — ABNORMAL HIGH (ref 4.0–10.5)
nRBC: 0 % (ref 0.0–0.2)

## 2021-11-06 LAB — CULTURE, RESPIRATORY W GRAM STAIN

## 2021-11-06 LAB — TRIGLYCERIDES: Triglycerides: 123 mg/dL (ref <150)

## 2021-11-06 MED ORDER — SODIUM CHLORIDE 0.9 % IV SOLN
3.0000 g | Freq: Four times a day (QID) | INTRAVENOUS | Status: AC
  Administered 2021-11-06 – 2021-11-09 (×14): 3 g via INTRAVENOUS
  Filled 2021-11-06 (×14): qty 8
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
## 2021-11-06 NOTE — Progress Notes (Signed)
SLP Cancellation Note  Patient Details Name: Ryan Nielsen MRN: [id number] DOB: 1975-09-23   Cancelled treatment:       Reason Eval/Treat Not Completed: Patient not medically ready (on the vent but hopeful for extubation soon). Will follow.     Mahala Menghini., M.A. CCC-SLP Acute Rehabilitation Services Office [phone]  11/06/2021, 8:04 AM

## 2021-11-07 LAB — CBC
HCT: 35.3 % — ABNORMAL LOW (ref 39.0–52.0)
Hemoglobin: 11.3 g/dL — ABNORMAL LOW (ref 13.0–17.0)
MCH: 32 pg (ref 26.0–34.0)
MCHC: 32 g/dL (ref 30.0–36.0)
MCV: 100 fL (ref 80.0–100.0)
Platelets: 427 10*3/uL — ABNORMAL HIGH (ref 150–400)
RBC: 3.53 MIL/uL — ABNORMAL LOW (ref 4.22–5.81)
RDW: 13.4 % (ref 11.5–15.5)
WBC: 11.9 10*3/uL — ABNORMAL HIGH (ref 4.0–10.5)
nRBC: 0 % (ref 0.0–0.2)

## 2021-11-07 LAB — BASIC METABOLIC PANEL
Anion gap: 9 (ref 5–15)
BUN: 19 mg/dL (ref 6–20)
CO2: 26 mmol/L (ref 22–32)
Calcium: 8.6 mg/dL — ABNORMAL LOW (ref 8.9–10.3)
Chloride: 104 mmol/L (ref 98–111)
Creatinine, Ser: 0.66 mg/dL (ref 0.61–1.24)
GFR, Estimated: >60 mL/min (ref >60)
Glucose, Bld: 90 mg/dL (ref 70–99)
Potassium: 4.1 mmol/L (ref 3.5–5.1)
Sodium: 139 mmol/L (ref 135–145)

## 2021-11-07 LAB — GLUCOSE, CAPILLARY
Glucose-Capillary: 104 mg/dL — ABNORMAL HIGH (ref 70–99)
Glucose-Capillary: 128 mg/dL — ABNORMAL HIGH (ref 70–99)
Glucose-Capillary: 130 mg/dL — ABNORMAL HIGH (ref 70–99)
Glucose-Capillary: 132 mg/dL — ABNORMAL HIGH (ref 70–99)
Glucose-Capillary: 76 mg/dL (ref 70–99)
Glucose-Capillary: 98 mg/dL (ref 70–99)

## 2021-11-07 NOTE — Progress Notes (Signed)
ETT advanced to 25 as previously recorded due audible leak. Pt tolerated well. MD was made aware.
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
## 2021-11-08 LAB — BASIC METABOLIC PANEL
Anion gap: 7 (ref 5–15)
BUN: 18 mg/dL (ref 6–20)
CO2: 27 mmol/L (ref 22–32)
Calcium: 8.6 mg/dL — ABNORMAL LOW (ref 8.9–10.3)
Chloride: 103 mmol/L (ref 98–111)
Creatinine, Ser: 0.66 mg/dL (ref 0.61–1.24)
GFR, Estimated: >60 mL/min (ref >60)
Glucose, Bld: 119 mg/dL — ABNORMAL HIGH (ref 70–99)
Potassium: 3.7 mmol/L (ref 3.5–5.1)
Sodium: 137 mmol/L (ref 135–145)

## 2021-11-08 LAB — GLUCOSE, CAPILLARY
Glucose-Capillary: 105 mg/dL — ABNORMAL HIGH (ref 70–99)
Glucose-Capillary: 112 mg/dL — ABNORMAL HIGH (ref 70–99)
Glucose-Capillary: 143 mg/dL — ABNORMAL HIGH (ref 70–99)
Glucose-Capillary: 119 mg/dL — ABNORMAL HIGH (ref 70–99)
Glucose-Capillary: 112 mg/dL — ABNORMAL HIGH (ref 70–99)
Glucose-Capillary: 118 mg/dL — ABNORMAL HIGH (ref 70–99)

## 2021-11-08 LAB — CBC
HCT: 32.6 % — ABNORMAL LOW (ref 39.0–52.0)
Hemoglobin: 10.6 g/dL — ABNORMAL LOW (ref 13.0–17.0)
MCH: 31.8 pg (ref 26.0–34.0)
MCHC: 32.5 g/dL (ref 30.0–36.0)
MCV: 97.9 fL (ref 80.0–100.0)
Platelets: 521 10*3/uL — ABNORMAL HIGH (ref 150–400)
RBC: 3.33 MIL/uL — ABNORMAL LOW (ref 4.22–5.81)
RDW: 13.2 % (ref 11.5–15.5)
WBC: 9.7 10*3/uL (ref 4.0–10.5)
nRBC: 0 % (ref 0.0–0.2)

## 2021-11-08 MED ORDER — DEXMEDETOMIDINE HCL IN NACL 400 MCG/100ML IV SOLN
0.4000 ug/kg/h | INTRAVENOUS | Status: DC
  Administered 2021-11-08: 0.8 ug/kg/h via INTRAVENOUS
  Administered 2021-11-08 (×2): 0.4 ug/kg/h via INTRAVENOUS
  Administered 2021-11-09 (×2): 1.2 ug/kg/h via INTRAVENOUS
  Administered 2021-11-09: 0.8 ug/kg/h via INTRAVENOUS
  Administered 2021-11-10: 1.2 ug/kg/h via INTRAVENOUS
  Administered 2021-11-10: 1 ug/kg/h via INTRAVENOUS
  Administered 2021-11-10 (×2): 1.2 ug/kg/h via INTRAVENOUS
  Administered 2021-11-11: 0.8 ug/kg/h via INTRAVENOUS
  Filled 2021-11-08 (×8): qty 100
  Filled 2021-11-08 (×2): qty 200
  Filled 2021-11-08: qty 100
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
## 2021-11-08 NOTE — Progress Notes (Signed)
Patient ID: Ryan Nielsen, male   DOB: [birth year], 46 y.o.   MRN: [id number] Vital signs are stable patient remains on vent significantly sedated.  No new changes from a neurosurgical standpoint very slow weaning process.

## 2021-11-08 NOTE — Plan of Care (Signed)
  Problem: Safety: Goal: Non-violent Restraint(s) Outcome: Not Progressing   Problem: Education: Goal: Knowledge of General Education information will improve Description: Including pain rating scale, medication(s)/side effects and non-pharmacologic comfort measures Outcome: Not Progressing   Problem: Health Behavior/Discharge Planning: Goal: Ability to manage health-related needs will improve Outcome: Not Progressing   Problem: Clinical Measurements: Goal: Ability to maintain clinical measurements within normal limits will improve Outcome: Progressing Goal: Will remain free from infection Outcome: Progressing Goal: Diagnostic test results will improve Outcome: Progressing Goal: Respiratory complications will improve Outcome: Not Progressing Goal: Cardiovascular complication will be avoided Outcome: Progressing   Problem: Activity: Goal: Risk for activity intolerance will decrease Outcome: Not Progressing   Problem: Nutrition: Goal: Adequate nutrition will be maintained Outcome: Progressing   Problem: Coping: Goal: Level of anxiety will decrease Outcome: Progressing   Problem: Elimination: Goal: Will not experience complications related to bowel motility Outcome: Progressing Goal: Will not experience complications related to urinary retention Outcome: Progressing   Problem: Safety: Goal: Ability to remain free from injury will improve Outcome: Progressing   Problem: Skin Integrity: Goal: Risk for impaired skin integrity will decrease Outcome: Progressing

## 2021-11-09 LAB — CBC
HCT: 32.1 % — ABNORMAL LOW (ref 39.0–52.0)
Hemoglobin: 10.2 g/dL — ABNORMAL LOW (ref 13.0–17.0)
MCH: 31.2 pg (ref 26.0–34.0)
MCHC: 31.8 g/dL (ref 30.0–36.0)
MCV: 98.2 fL (ref 80.0–100.0)
Platelets: 609 10*3/uL — ABNORMAL HIGH (ref 150–400)
RBC: 3.27 MIL/uL — ABNORMAL LOW (ref 4.22–5.81)
RDW: 13.1 % (ref 11.5–15.5)
WBC: 10.3 10*3/uL (ref 4.0–10.5)
nRBC: 0 % (ref 0.0–0.2)

## 2021-11-09 LAB — BASIC METABOLIC PANEL
Anion gap: 6 (ref 5–15)
BUN: 18 mg/dL (ref 6–20)
CO2: 25 mmol/L (ref 22–32)
Calcium: 8.9 mg/dL (ref 8.9–10.3)
Chloride: 106 mmol/L (ref 98–111)
Creatinine, Ser: 0.7 mg/dL (ref 0.61–1.24)
GFR, Estimated: >60 mL/min (ref >60)
Glucose, Bld: 106 mg/dL — ABNORMAL HIGH (ref 70–99)
Potassium: 3.8 mmol/L (ref 3.5–5.1)
Sodium: 137 mmol/L (ref 135–145)

## 2021-11-09 LAB — GLUCOSE, CAPILLARY
Glucose-Capillary: 112 mg/dL — ABNORMAL HIGH (ref 70–99)
Glucose-Capillary: 126 mg/dL — ABNORMAL HIGH (ref 70–99)
Glucose-Capillary: 123 mg/dL — ABNORMAL HIGH (ref 70–99)
Glucose-Capillary: 100 mg/dL — ABNORMAL HIGH (ref 70–99)
Glucose-Capillary: 98 mg/dL (ref 70–99)
Glucose-Capillary: 119 mg/dL — ABNORMAL HIGH (ref 70–99)

## 2021-11-09 LAB — TRIGLYCERIDES: Triglycerides: 104 mg/dL (ref <150)

## 2021-11-09 MED ORDER — POTASSIUM CHLORIDE 20 MEQ PO PACK
20.0000 meq | PACK | Freq: Once | ORAL | Status: AC
Start: 2021-11-09 — End: 2021-11-09
  Administered 2021-11-09: 20 meq
  Filled 2021-11-09: qty 1

## 2021-11-09 NOTE — Progress Notes (Signed)
Pt placed on PS/CPAP 10/5 on 40% and is tolerating well. RT will monitor. 
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
## 2021-11-10 LAB — GLUCOSE, CAPILLARY
Glucose-Capillary: 147 mg/dL — ABNORMAL HIGH (ref 70–99)
Glucose-Capillary: 115 mg/dL — ABNORMAL HIGH (ref 70–99)
Glucose-Capillary: 125 mg/dL — ABNORMAL HIGH (ref 70–99)
Glucose-Capillary: 122 mg/dL — ABNORMAL HIGH (ref 70–99)
Glucose-Capillary: 123 mg/dL — ABNORMAL HIGH (ref 70–99)

## 2021-11-10 LAB — CBC
HCT: 33.6 % — ABNORMAL LOW (ref 39.0–52.0)
Hemoglobin: 10.9 g/dL — ABNORMAL LOW (ref 13.0–17.0)
MCH: 31.8 pg (ref 26.0–34.0)
MCHC: 32.4 g/dL (ref 30.0–36.0)
MCV: 98 fL (ref 80.0–100.0)
Platelets: 614 10*3/uL — ABNORMAL HIGH (ref 150–400)
RBC: 3.43 MIL/uL — ABNORMAL LOW (ref 4.22–5.81)
RDW: 12.8 % (ref 11.5–15.5)
WBC: 8.4 10*3/uL (ref 4.0–10.5)
nRBC: 0 % (ref 0.0–0.2)

## 2021-11-10 LAB — BASIC METABOLIC PANEL
Anion gap: 8 (ref 5–15)
BUN: 18 mg/dL (ref 6–20)
CO2: 24 mmol/L (ref 22–32)
Calcium: 8.8 mg/dL — ABNORMAL LOW (ref 8.9–10.3)
Chloride: 106 mmol/L (ref 98–111)
Creatinine, Ser: 0.56 mg/dL — ABNORMAL LOW (ref 0.61–1.24)
GFR, Estimated: >60 mL/min (ref >60)
Glucose, Bld: 128 mg/dL — ABNORMAL HIGH (ref 70–99)
Potassium: 3.6 mmol/L (ref 3.5–5.1)
Sodium: 138 mmol/L (ref 135–145)

## 2021-11-10 MED ORDER — QUETIAPINE FUMARATE 25 MG PO TABS
150.0000 mg | ORAL_TABLET | Freq: Two times a day (BID) | ORAL | Status: DC
  Administered 2021-11-10 – 2021-11-11 (×4): 150 mg
  Filled 2021-11-10 (×4): qty 2

## 2021-11-10 MED ORDER — CLONAZEPAM 1 MG PO TABS
1.0000 mg | ORAL_TABLET | Freq: Two times a day (BID) | ORAL | Status: DC
  Administered 2021-11-10 – 2021-11-11 (×4): 1 mg
  Filled 2021-11-10 (×4): qty 1

## 2021-11-10 MED ORDER — POTASSIUM CHLORIDE 20 MEQ PO PACK
40.0000 meq | PACK | Freq: Two times a day (BID) | ORAL | Status: AC
  Administered 2021-11-10 – 2021-11-11 (×2): 40 meq
  Filled 2021-11-10: qty 2

## 2021-11-10 MED ORDER — POTASSIUM CHLORIDE 20 MEQ PO PACK
40.0000 meq | PACK | Freq: Two times a day (BID) | ORAL | Status: DC
  Filled 2021-11-10: qty 2
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
## 2021-11-10 NOTE — Progress Notes (Signed)
SLP Cancellation Note  Patient Details Name: Ryan Nielsen MRN: [id number] DOB: Mar 04, 1976   Cancelled treatment:        Attempted to see pt for swallowing and cognitive-linguistic assessment.  Pt is still intubated at this time.  SLP will reattempt when pt is medically appropriate for evaluation.   Kerrie Pleasure, MA, CCC-SLP Acute Rehabilitation Services Office: [phone] 11/10/2021, 9:28 AM

## 2021-11-11 ENCOUNTER — Inpatient Hospital Stay (HOSPITAL_COMMUNITY): Payer: Self-pay

## 2021-11-11 LAB — GLUCOSE, CAPILLARY
Glucose-Capillary: 112 mg/dL — ABNORMAL HIGH (ref 70–99)
Glucose-Capillary: 112 mg/dL — ABNORMAL HIGH (ref 70–99)
Glucose-Capillary: 114 mg/dL — ABNORMAL HIGH (ref 70–99)
Glucose-Capillary: 120 mg/dL — ABNORMAL HIGH (ref 70–99)
Glucose-Capillary: 97 mg/dL (ref 70–99)
Glucose-Capillary: 99 mg/dL (ref 70–99)

## 2021-11-11 LAB — BASIC METABOLIC PANEL
Anion gap: 6 (ref 5–15)
BUN: 22 mg/dL — ABNORMAL HIGH (ref 6–20)
CO2: 27 mmol/L (ref 22–32)
Calcium: 9 mg/dL (ref 8.9–10.3)
Chloride: 102 mmol/L (ref 98–111)
Creatinine, Ser: 0.69 mg/dL (ref 0.61–1.24)
GFR, Estimated: >60 mL/min (ref >60)
Glucose, Bld: 112 mg/dL — ABNORMAL HIGH (ref 70–99)
Potassium: 4.5 mmol/L (ref 3.5–5.1)
Sodium: 135 mmol/L (ref 135–145)

## 2021-11-11 LAB — CBC
HCT: 32.8 % — ABNORMAL LOW (ref 39.0–52.0)
Hemoglobin: 10.7 g/dL — ABNORMAL LOW (ref 13.0–17.0)
MCH: 32.2 pg (ref 26.0–34.0)
MCHC: 32.6 g/dL (ref 30.0–36.0)
MCV: 98.8 fL (ref 80.0–100.0)
Platelets: 690 10*3/uL — ABNORMAL HIGH (ref 150–400)
RBC: 3.32 MIL/uL — ABNORMAL LOW (ref 4.22–5.81)
RDW: 12.7 % (ref 11.5–15.5)
WBC: 9.4 10*3/uL (ref 4.0–10.5)
nRBC: 0 % (ref 0.0–0.2)

## 2021-11-11 MED ORDER — OXYCODONE HCL 5 MG/5ML PO SOLN: 5.0000 mg | Freq: Four times a day (QID) | ORAL | Status: DC

## 2021-11-11 MED ORDER — ETOMIDATE 2 MG/ML IV SOLN
INTRAVENOUS | Status: AC
  Filled 2021-11-11: qty 20

## 2021-11-11 MED ORDER — CHLORHEXIDINE GLUCONATE 0.12 % MT SOLN
OROMUCOSAL | Status: AC
  Filled 2021-11-11: qty 15

## 2021-11-11 MED ORDER — OXYCODONE HCL 5 MG/5ML PO SOLN
5.0000 mg | Freq: Four times a day (QID) | ORAL | Status: DC
  Administered 2021-11-11 – 2021-11-12 (×4): 5 mg
  Filled 2021-11-11 (×4): qty 5

## 2021-11-11 MED ORDER — ROCURONIUM BROMIDE 10 MG/ML (PF) SYRINGE
PREFILLED_SYRINGE | INTRAVENOUS | Status: AC
  Filled 2021-11-11: qty 10

## 2021-11-11 NOTE — Progress Notes (Signed)
SLP Cancellation Note  Patient Details Name: Ryan Nielsen MRN: [id number] DOB: May 05, 1976   Cancelled treatment:       Reason Eval/Treat Not Completed: Patient not medically ready   Rexanna Louthan, [name] 11/11/2021, 11:12 AM
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
## 2021-11-11 NOTE — Progress Notes (Signed)
Pt noted to have hiccups at the start of the shift, which the day RN had stopped pt's TF d/t pt apparently c/o nausea.

## 2021-11-11 NOTE — Plan of Care (Signed)
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
## 2021-11-11 NOTE — Procedures (Signed)
Extubation Procedure Note  Patient Details:   Name: Ryan Nielsen DOB: April 06, 1976 MRN: [id number]   Airway Documentation:    Vent end date: 11/11/21 Vent end time: 1227   Evaluation  O2 sats: stable throughout Complications: No apparent complications Patient did tolerate procedure well. Bilateral Breath Sounds: Clear, Diminished   Yes  Pt was suctioned orally and via ETT. Audible cuff leak was heard. RN and Dr. Bedelia Person at bedside. Pt was successfully extubated to 4L [place]. No stridor noted. RT will continue to monitor.  Hillis Range 11/11/2021, 12:36 PM

## 2021-11-11 NOTE — Progress Notes (Signed)
Pt noted to have hiccups after admin of meds being admin via tube.

## 2021-11-12 ENCOUNTER — Other Ambulatory Visit: Payer: Self-pay

## 2021-11-12 LAB — BASIC METABOLIC PANEL
Anion gap: 10 (ref 5–15)
BUN: 16 mg/dL (ref 6–20)
CO2: 25 mmol/L (ref 22–32)
Calcium: 9.4 mg/dL (ref 8.9–10.3)
Chloride: 98 mmol/L (ref 98–111)
Creatinine, Ser: 0.8 mg/dL (ref 0.61–1.24)
GFR, Estimated: >60 mL/min (ref >60)
Glucose, Bld: 107 mg/dL — ABNORMAL HIGH (ref 70–99)
Potassium: 3.7 mmol/L (ref 3.5–5.1)
Sodium: 133 mmol/L — ABNORMAL LOW (ref 135–145)

## 2021-11-12 LAB — CBC
HCT: 36 % — ABNORMAL LOW (ref 39.0–52.0)
Hemoglobin: 11.8 g/dL — ABNORMAL LOW (ref 13.0–17.0)
MCH: 32 pg (ref 26.0–34.0)
MCHC: 32.8 g/dL (ref 30.0–36.0)
MCV: 97.6 fL (ref 80.0–100.0)
Platelets: 795 10*3/uL — ABNORMAL HIGH (ref 150–400)
RBC: 3.69 MIL/uL — ABNORMAL LOW (ref 4.22–5.81)
RDW: 12.9 % (ref 11.5–15.5)
WBC: 14.5 10*3/uL — ABNORMAL HIGH (ref 4.0–10.5)
nRBC: 0 % (ref 0.0–0.2)

## 2021-11-12 LAB — GLUCOSE, CAPILLARY
Glucose-Capillary: 108 mg/dL — ABNORMAL HIGH (ref 70–99)
Glucose-Capillary: 109 mg/dL — ABNORMAL HIGH (ref 70–99)
Glucose-Capillary: 113 mg/dL — ABNORMAL HIGH (ref 70–99)
Glucose-Capillary: 114 mg/dL — ABNORMAL HIGH (ref 70–99)
Glucose-Capillary: 115 mg/dL — ABNORMAL HIGH (ref 70–99)
Glucose-Capillary: 117 mg/dL — ABNORMAL HIGH (ref 70–99)
Glucose-Capillary: 96 mg/dL (ref 70–99)

## 2021-11-12 MED ORDER — METOPROLOL TARTRATE 25 MG/10 ML ORAL SUSPENSION
25.0000 mg | Freq: Two times a day (BID) | ORAL | Status: DC
  Administered 2021-11-12 – 2021-11-18 (×12): 25 mg
  Filled 2021-11-12 (×16): qty 10

## 2021-11-12 MED ORDER — METHOCARBAMOL 500 MG PO TABS
1000.0000 mg | ORAL_TABLET | Freq: Three times a day (TID) | ORAL | Status: DC
  Administered 2021-11-12 – 2021-11-18 (×18): 1000 mg
  Filled 2021-11-12 (×19): qty 2

## 2021-11-12 MED ORDER — ACETAMINOPHEN 500 MG PO TABS: 1000.0000 mg | ORAL_TABLET | Freq: Four times a day (QID) | ORAL | Status: DC

## 2021-11-12 MED ORDER — QUETIAPINE FUMARATE 25 MG PO TABS: 150.0000 mg | ORAL_TABLET | Freq: Two times a day (BID) | ORAL | Status: DC

## 2021-11-12 MED ORDER — POTASSIUM CHLORIDE CRYS ER 20 MEQ PO TBCR
40.0000 meq | EXTENDED_RELEASE_TABLET | Freq: Once | ORAL | Status: DC
Start: 2021-11-12 — End: 2021-11-12

## 2021-11-12 MED ORDER — OXYCODONE HCL 5 MG/5ML PO SOLN
5.0000 mg | ORAL | Status: DC | PRN
  Administered 2021-11-16: 10 mg
  Filled 2021-11-12: qty 10

## 2021-11-12 MED ORDER — OXYCODONE HCL 5 MG PO TABS: 5.0000 mg | ORAL_TABLET | ORAL | Status: DC | PRN

## 2021-11-12 MED ORDER — DOCUSATE SODIUM 50 MG/5ML PO LIQD
100.0000 mg | Freq: Two times a day (BID) | ORAL | Status: DC
  Administered 2021-11-13 – 2021-11-18 (×6): 100 mg
  Filled 2021-11-12 (×8): qty 10

## 2021-11-12 MED ORDER — ACETAMINOPHEN 500 MG PO TABS
1000.0000 mg | ORAL_TABLET | Freq: Four times a day (QID) | ORAL | Status: DC
  Administered 2021-11-12 – 2021-11-18 (×22): 1000 mg
  Filled 2021-11-12 (×24): qty 2

## 2021-11-12 MED ORDER — METOCLOPRAMIDE HCL 5 MG/ML IJ SOLN
10.0000 mg | Freq: Four times a day (QID) | INTRAMUSCULAR | Status: DC
Start: 2021-11-12 — End: 2021-11-14
  Administered 2021-11-12 – 2021-11-14 (×8): 10 mg via INTRAVENOUS
  Filled 2021-11-12 (×8): qty 2

## 2021-11-12 MED ORDER — POTASSIUM CHLORIDE 20 MEQ PO PACK
40.0000 meq | PACK | Freq: Once | ORAL | Status: AC
  Administered 2021-11-12: 40 meq
  Filled 2021-11-12: qty 2

## 2021-11-12 MED ORDER — ADULT MULTIVITAMIN W/MINERALS CH: 1.0000 | ORAL_TABLET | Freq: Every day | ORAL | Status: DC

## 2021-11-12 MED ORDER — SODIUM CHLORIDE 0.9 % IV SOLN
25.0000 mg | Freq: Three times a day (TID) | INTRAVENOUS | Status: DC | PRN
  Administered 2021-11-12: 25 mg via INTRAVENOUS
  Filled 2021-11-12: qty 1

## 2021-11-12 MED ORDER — DOCUSATE SODIUM 100 MG PO CAPS: 100.0000 mg | ORAL_CAPSULE | Freq: Two times a day (BID) | ORAL | Status: DC

## 2021-11-12 MED ORDER — CLONAZEPAM 1 MG PO TABS: 1.0000 mg | ORAL_TABLET | Freq: Two times a day (BID) | ORAL | Status: DC

## 2021-11-12 MED ORDER — QUETIAPINE FUMARATE 25 MG PO TABS
150.0000 mg | ORAL_TABLET | Freq: Two times a day (BID) | ORAL | Status: DC
  Administered 2021-11-12 (×2): 150 mg
  Filled 2021-11-12 (×2): qty 2

## 2021-11-12 MED ORDER — CLONAZEPAM 1 MG PO TABS
1.0000 mg | ORAL_TABLET | Freq: Two times a day (BID) | ORAL | Status: DC
  Administered 2021-11-12 – 2021-11-13 (×4): 1 mg
  Filled 2021-11-12 (×4): qty 1

## 2021-11-12 MED ORDER — PANTOPRAZOLE 2 MG/ML SUSPENSION
40.0000 mg | Freq: Every day | ORAL | Status: DC
  Administered 2021-11-12 – 2021-11-14 (×3): 40 mg
  Filled 2021-11-12 (×3): qty 20

## 2021-11-12 MED ORDER — OXYCODONE HCL 5 MG/5ML PO SOLN
5.0000 mg | Freq: Four times a day (QID) | ORAL | Status: DC
  Administered 2021-11-12 – 2021-11-15 (×12): 5 mg
  Filled 2021-11-12 (×12): qty 5

## 2021-11-12 MED ORDER — ADULT MULTIVITAMIN W/MINERALS CH
1.0000 | ORAL_TABLET | Freq: Every day | ORAL | Status: DC
  Administered 2021-11-12 – 2021-11-18 (×7): 1
  Filled 2021-11-12 (×7): qty 1

## 2021-11-12 MED ORDER — SODIUM CHLORIDE 0.9 % IV SOLN: INTRAVENOUS | Status: DC

## 2021-11-12 MED ORDER — PANTOPRAZOLE SODIUM 40 MG PO TBEC
40.0000 mg | DELAYED_RELEASE_TABLET | Freq: Every day | ORAL | Status: DC
Start: 2021-11-12 — End: 2021-11-12

## 2021-11-12 MED ORDER — CHLORHEXIDINE GLUCONATE 0.12 % MT SOLN
OROMUCOSAL | Status: AC
  Filled 2021-11-12: qty 15

## 2021-11-12 MED ORDER — METHOCARBAMOL 500 MG PO TABS: 1000.0000 mg | ORAL_TABLET | Freq: Three times a day (TID) | ORAL | Status: DC

## 2021-11-12 MED ORDER — OXYCODONE HCL 5 MG PO TABS: 5.0000 mg | ORAL_TABLET | Freq: Four times a day (QID) | ORAL | Status: DC
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
## 2021-11-12 NOTE — Progress Notes (Signed)
Pt w/ hiccups, not allowing pt to sleep. Dr. Donell Beers informed.

## 2021-11-12 NOTE — Plan of Care (Signed)
  Problem: Education: Goal: Knowledge of General Education information will improve Description: Including pain rating scale, medication(s)/side effects and non-pharmacologic comfort measures Outcome: Progressing   Problem: Health Behavior/Discharge Planning: Goal: Ability to manage health-related needs will improve Outcome: Progressing   Problem: Clinical Measurements: Goal: Ability to maintain clinical measurements within normal limits will improve Outcome: Progressing   Problem: Activity: Goal: Risk for activity intolerance will decrease Outcome: Progressing   Problem: Nutrition: Goal: Adequate nutrition will be maintained Outcome: Progressing   Problem: Safety: Goal: Ability to remain free from injury will improve Outcome: Progressing   Problem: Skin Integrity: Goal: Risk for impaired skin integrity will decrease Outcome: Progressing   
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
## 2021-11-13 LAB — BASIC METABOLIC PANEL
Anion gap: 8 (ref 5–15)
BUN: 17 mg/dL (ref 6–20)
CO2: 25 mmol/L (ref 22–32)
Calcium: 9.9 mg/dL (ref 8.9–10.3)
Chloride: 103 mmol/L (ref 98–111)
Creatinine, Ser: 0.74 mg/dL (ref 0.61–1.24)
GFR, Estimated: >60 mL/min (ref >60)
Glucose, Bld: 93 mg/dL (ref 70–99)
Potassium: 4 mmol/L (ref 3.5–5.1)
Sodium: 136 mmol/L (ref 135–145)

## 2021-11-13 LAB — GLUCOSE, CAPILLARY
Glucose-Capillary: 93 mg/dL (ref 70–99)
Glucose-Capillary: 100 mg/dL — ABNORMAL HIGH (ref 70–99)
Glucose-Capillary: 100 mg/dL — ABNORMAL HIGH (ref 70–99)
Glucose-Capillary: 98 mg/dL (ref 70–99)
Glucose-Capillary: 98 mg/dL (ref 70–99)

## 2021-11-13 LAB — CBC
HCT: 37.8 % — ABNORMAL LOW (ref 39.0–52.0)
Hemoglobin: 12 g/dL — ABNORMAL LOW (ref 13.0–17.0)
MCH: 31.3 pg (ref 26.0–34.0)
MCHC: 31.7 g/dL (ref 30.0–36.0)
MCV: 98.4 fL (ref 80.0–100.0)
Platelets: 894 10*3/uL — ABNORMAL HIGH (ref 150–400)
RBC: 3.84 MIL/uL — ABNORMAL LOW (ref 4.22–5.81)
RDW: 13.1 % (ref 11.5–15.5)
WBC: 12 10*3/uL — ABNORMAL HIGH (ref 4.0–10.5)
nRBC: 0 % (ref 0.0–0.2)

## 2021-11-13 MED ORDER — CHLORPROMAZINE HCL 25 MG/ML IJ SOLN
25.0000 mg | Freq: Once | INTRAMUSCULAR | Status: DC
  Filled 2021-11-13: qty 1

## 2021-11-13 MED ORDER — QUETIAPINE FUMARATE 100 MG PO TABS
100.0000 mg | ORAL_TABLET | Freq: Two times a day (BID) | ORAL | Status: DC
Start: 2021-11-13 — End: 2021-11-15
  Administered 2021-11-13 – 2021-11-14 (×4): 100 mg
  Filled 2021-11-13 (×4): qty 1

## 2021-11-13 MED ORDER — CHLORHEXIDINE GLUCONATE 0.12 % MT SOLN
OROMUCOSAL | Status: AC
  Filled 2021-11-13: qty 15

## 2021-11-13 MED ORDER — PIVOT 1.5 CAL PO LIQD
1000.0000 mL | ORAL | Status: DC
  Administered 2021-11-13 – 2021-11-18 (×6): 1000 mL
  Filled 2021-11-13 (×8): qty 1000
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
## 2021-11-13 NOTE — TOC Progression Note (Signed)
Transition of Care [facility]) - Progression Note    Patient Details  Name: Ryan Nielsen MRN: [id number] Date of Birth: 1976/01/02  [facility]) CM/SW Contact  Glennon Mac, RN Phone Number: 11/13/2021, 3:03 PM  Clinical Narrative:    Spoke with patient's sister, Gabriel Rung, to discuss support when discharged from rehab.  Sister states that patient's girlfriend, "Jas" or "Leavy Cella" is supportive of patient, and feels she will be able to provide needed care.  Sister provided girlfriend's phone number;  left message for girlfriend at [phone].     Expected Discharge Plan: IP Rehab Facility Barriers to Discharge: Continued Medical Work up  Expected Discharge Plan and Services Expected Discharge Plan: IP Rehab Facility   Discharge Planning Services: CM Consult   Living arrangements for the past 2 months: Single Family Home                                       Social Determinants of Health (SDOH) Interventions    Readmission Risk Interventions     No data to display         Quintella Baton, RN, BSN  Trauma/Neuro ICU Case Manager [phone]
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
## 2021-11-13 NOTE — Progress Notes (Signed)
Inpatient Rehab Admissions Coordinator:   Per OT recommendations,  patient was screened for CIR candidacy by [name], MS, CCC-SLP . At this time, Pt. Appears to be a a potential candidate for CIR. I will place   order for rehab consult per protocol for full assessment.  Note that it is not clear that Pt. Has an appropriate dispo following CIR. Will need to confirm a safe dispo prior to admission.  Please contact me any with questions.  [name], MS, CCC-SLP Rehab Admissions Coordinator  [phone] (celll) [phone] (office)
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
## 2021-11-14 LAB — BASIC METABOLIC PANEL
Anion gap: 10 (ref 5–15)
BUN: 21 mg/dL — ABNORMAL HIGH (ref 6–20)
CO2: 24 mmol/L (ref 22–32)
Calcium: 9.4 mg/dL (ref 8.9–10.3)
Chloride: 102 mmol/L (ref 98–111)
Creatinine, Ser: 0.7 mg/dL (ref 0.61–1.24)
GFR, Estimated: >60 mL/min (ref >60)
Glucose, Bld: 105 mg/dL — ABNORMAL HIGH (ref 70–99)
Potassium: 3.5 mmol/L (ref 3.5–5.1)
Sodium: 136 mmol/L (ref 135–145)

## 2021-11-14 LAB — CBC
HCT: 37.6 % — ABNORMAL LOW (ref 39.0–52.0)
Hemoglobin: 12.1 g/dL — ABNORMAL LOW (ref 13.0–17.0)
MCH: 31.3 pg (ref 26.0–34.0)
MCHC: 32.2 g/dL (ref 30.0–36.0)
MCV: 97.2 fL (ref 80.0–100.0)
Platelets: 868 10*3/uL — ABNORMAL HIGH (ref 150–400)
RBC: 3.87 MIL/uL — ABNORMAL LOW (ref 4.22–5.81)
RDW: 12.9 % (ref 11.5–15.5)
WBC: 8.6 10*3/uL (ref 4.0–10.5)
nRBC: 0 % (ref 0.0–0.2)

## 2021-11-14 LAB — GLUCOSE, CAPILLARY
Glucose-Capillary: 101 mg/dL — ABNORMAL HIGH (ref 70–99)
Glucose-Capillary: 115 mg/dL — ABNORMAL HIGH (ref 70–99)
Glucose-Capillary: 121 mg/dL — ABNORMAL HIGH (ref 70–99)
Glucose-Capillary: 121 mg/dL — ABNORMAL HIGH (ref 70–99)
Glucose-Capillary: 125 mg/dL — ABNORMAL HIGH (ref 70–99)
Glucose-Capillary: 126 mg/dL — ABNORMAL HIGH (ref 70–99)
Glucose-Capillary: 146 mg/dL — ABNORMAL HIGH (ref 70–99)

## 2021-11-14 MED ORDER — CLONAZEPAM 0.5 MG PO TABS
0.5000 mg | ORAL_TABLET | Freq: Two times a day (BID) | ORAL | Status: DC
Start: 2021-11-14 — End: 2021-11-15
  Administered 2021-11-14 (×2): 0.5 mg
  Filled 2021-11-14 (×2): qty 1

## 2021-11-14 MED ORDER — ORAL CARE MOUTH RINSE
15.0000 mL | OROMUCOSAL | Status: DC
  Administered 2021-11-14 – 2021-11-20 (×20): 15 mL via OROMUCOSAL

## 2021-11-14 MED ORDER — METOCLOPRAMIDE HCL 5 MG/ML IJ SOLN
5.0000 mg | Freq: Four times a day (QID) | INTRAMUSCULAR | Status: DC
Start: 2021-11-14 — End: 2021-11-15
  Administered 2021-11-14 – 2021-11-15 (×4): 5 mg via INTRAVENOUS
  Filled 2021-11-14 (×4): qty 2

## 2021-11-14 MED ORDER — ORAL CARE MOUTH RINSE
15.0000 mL | OROMUCOSAL | Status: DC | PRN
Start: 2021-11-14 — End: 2021-11-26
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
## 2021-11-14 NOTE — Progress Notes (Addendum)
Inpatient Rehab Admissions Coordinator:   I spoke with pt's sister and cousin regarding CIR and potential dispo. They state he was homeless PTA and that no family is able to take him in to provide 24/7 assist, as the all work and/or are disabled, have small children in the home, etc. They report that pt. Is uninsured and do not think Pt. Has insurance or Medicaid, though his cousin thought he may have an [employer].  I will not pursue for CIR admit due to lack of appropriate dispo at d/c, but I encouraged Pt.'s family to contact me if anyone's situation changes and they decide that they could take Pt. In and provide care. I will notify TOC.  [name], MS, CCC-SLP Rehab Admissions Coordinator  [phone] (celll) [phone] (office)

## 2021-11-15 LAB — CBC
HCT: 37.5 % — ABNORMAL LOW (ref 39.0–52.0)
Hemoglobin: 12 g/dL — ABNORMAL LOW (ref 13.0–17.0)
MCH: 31.3 pg (ref 26.0–34.0)
MCHC: 32 g/dL (ref 30.0–36.0)
MCV: 97.9 fL (ref 80.0–100.0)
Platelets: 889 10*3/uL — ABNORMAL HIGH (ref 150–400)
RBC: 3.83 MIL/uL — ABNORMAL LOW (ref 4.22–5.81)
RDW: 12.6 % (ref 11.5–15.5)
WBC: 7.7 10*3/uL (ref 4.0–10.5)
nRBC: 0 % (ref 0.0–0.2)

## 2021-11-15 LAB — GLUCOSE, CAPILLARY
Glucose-Capillary: 99 mg/dL (ref 70–99)
Glucose-Capillary: 127 mg/dL — ABNORMAL HIGH (ref 70–99)
Glucose-Capillary: 109 mg/dL — ABNORMAL HIGH (ref 70–99)
Glucose-Capillary: 125 mg/dL — ABNORMAL HIGH (ref 70–99)
Glucose-Capillary: 104 mg/dL — ABNORMAL HIGH (ref 70–99)
Glucose-Capillary: 106 mg/dL — ABNORMAL HIGH (ref 70–99)

## 2021-11-15 LAB — BASIC METABOLIC PANEL
Anion gap: 5 (ref 5–15)
BUN: 18 mg/dL (ref 6–20)
CO2: 25 mmol/L (ref 22–32)
Calcium: 9.3 mg/dL (ref 8.9–10.3)
Chloride: 104 mmol/L (ref 98–111)
Creatinine, Ser: 0.6 mg/dL — ABNORMAL LOW (ref 0.61–1.24)
GFR, Estimated: >60 mL/min (ref >60)
Glucose, Bld: 103 mg/dL — ABNORMAL HIGH (ref 70–99)
Potassium: 3.7 mmol/L (ref 3.5–5.1)
Sodium: 134 mmol/L — ABNORMAL LOW (ref 135–145)

## 2021-11-15 MED ORDER — QUETIAPINE FUMARATE 25 MG PO TABS
75.0000 mg | ORAL_TABLET | Freq: Two times a day (BID) | ORAL | Status: DC
Start: 2021-11-15 — End: 2021-11-19
  Administered 2021-11-15 – 2021-11-18 (×7): 75 mg
  Filled 2021-11-15: qty 3
  Filled 2021-11-15 (×2): qty 2
  Filled 2021-11-15: qty 3
  Filled 2021-11-15: qty 2
  Filled 2021-11-15: qty 3
  Filled 2021-11-15: qty 2

## 2021-11-15 MED ORDER — CLONAZEPAM 0.5 MG PO TABS: 0.2500 mg | ORAL_TABLET | Freq: Two times a day (BID) | ORAL | Status: DC
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
## 2021-11-15 NOTE — TOC Progression Note (Signed)
Transition of Care [facility]) - Progression Note    Patient Details  Name: Ryan Nielsen MRN: [id number] Date of Birth: October 31, 1975  Transition of Care [facility]) CM/SW [place], [place] Phone Number: 11/15/2021, 10:07 AM  Clinical Narrative:    CSW was informed by PT/OT that CIR feels patient does not have adequate discharge support and they would no longer be proceeding. CSW met with patient and discussed and notes due to no insurance SNF placement will be nearly impossible and informed patient of such. CSW will proceed with SNF work up and request follow-up with [facility] leadership to discuss options for support.    Expected Discharge Plan: Skilled Nursing Facility Barriers to Discharge: Insurance Authorization  Expected Discharge Plan and Services Expected Discharge Plan: [place]   Discharge Planning Services: CM Consult   Living arrangements for the past 2 months: Single Family Home                                       Social Determinants of Health (SDOH) Interventions    Readmission Risk Interventions     No data to display

## 2021-11-15 NOTE — Progress Notes (Addendum)
Approached by Barnett Applebaum, NT and reports pt pulled out his CorTrack.... paged on call dietician on Amion.  Paged Dr. Drexel Iha .... Reports not to worry about it for now and will re-address in the morning.
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
## 2021-11-16 ENCOUNTER — Inpatient Hospital Stay (HOSPITAL_COMMUNITY): Payer: Self-pay

## 2021-11-16 LAB — BASIC METABOLIC PANEL
Anion gap: 9 (ref 5–15)
BUN: 15 mg/dL (ref 6–20)
CO2: 25 mmol/L (ref 22–32)
Calcium: 9.9 mg/dL (ref 8.9–10.3)
Chloride: 102 mmol/L (ref 98–111)
Creatinine, Ser: 0.69 mg/dL (ref 0.61–1.24)
GFR, Estimated: >60 mL/min (ref >60)
Glucose, Bld: 103 mg/dL — ABNORMAL HIGH (ref 70–99)
Potassium: 3.9 mmol/L (ref 3.5–5.1)
Sodium: 136 mmol/L (ref 135–145)

## 2021-11-16 LAB — CBC
HCT: 40.6 % (ref 39.0–52.0)
Hemoglobin: 12.8 g/dL — ABNORMAL LOW (ref 13.0–17.0)
MCH: 31.1 pg (ref 26.0–34.0)
MCHC: 31.5 g/dL (ref 30.0–36.0)
MCV: 98.8 fL (ref 80.0–100.0)
Platelets: 906 10*3/uL (ref 150–400)
RBC: 4.11 MIL/uL — ABNORMAL LOW (ref 4.22–5.81)
RDW: 12.4 % (ref 11.5–15.5)
WBC: 8.8 10*3/uL (ref 4.0–10.5)
nRBC: 0 % (ref 0.0–0.2)

## 2021-11-16 LAB — GLUCOSE, CAPILLARY
Glucose-Capillary: 106 mg/dL — ABNORMAL HIGH (ref 70–99)
Glucose-Capillary: 118 mg/dL — ABNORMAL HIGH (ref 70–99)
Glucose-Capillary: 134 mg/dL — ABNORMAL HIGH (ref 70–99)
Glucose-Capillary: 93 mg/dL (ref 70–99)
Glucose-Capillary: 95 mg/dL (ref 70–99)

## 2021-11-16 MED ORDER — HYDROMORPHONE HCL 1 MG/ML IJ SOLN
0.5000 mg | Freq: Once | INTRAMUSCULAR | Status: DC
  Filled 2021-11-16: qty 0.5

## 2021-11-16 MED ORDER — LORAZEPAM 2 MG/ML IJ SOLN
0.5000 mg | Freq: Once | INTRAMUSCULAR | Status: DC
  Filled 2021-11-16: qty 1

## 2021-11-16 MED ORDER — HYDROMORPHONE HCL 1 MG/ML IJ SOLN
0.5000 mg | Freq: Once | INTRAMUSCULAR | Status: AC
  Administered 2021-11-16: 0.5 mg via INTRAVENOUS

## 2021-11-16 MED ORDER — CLONAZEPAM 0.25 MG PO TBDP
0.2500 mg | ORAL_TABLET | Freq: Two times a day (BID) | ORAL | Status: DC
  Administered 2021-11-16 (×2): 0.25 mg
  Filled 2021-11-16 (×2): qty 1

## 2021-11-16 MED ORDER — LORAZEPAM 2 MG/ML IJ SOLN
0.5000 mg | Freq: Once | INTRAMUSCULAR | Status: AC
  Administered 2021-11-16: 0.5 mg via INTRAVENOUS
  Filled 2021-11-16: qty 1

## 2021-11-16 MED ORDER — CLONAZEPAM 0.25 MG PO TBDP
0.2500 mg | ORAL_TABLET | Freq: Two times a day (BID) | ORAL | Status: DC
Start: 2021-11-16 — End: 2021-11-16
  Filled 2021-11-16: qty 1

## 2021-11-16 MED ORDER — HYDROMORPHONE HCL 1 MG/ML IJ SOLN
INTRAMUSCULAR | Status: AC
  Filled 2021-11-16: qty 0.5

## 2021-11-16 NOTE — Progress Notes (Signed)
Date and time results received: 11/16/21 4:28 AM  Test: Platelets Critical Value: 906  Name of Provider Notified: Dr. Sherren Mocha  Orders Received? Or Actions Taken?:  Dr. Earney Mallet called and no new orders given at the moment.
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
## 2021-11-16 NOTE — Progress Notes (Addendum)
Inpatient Rehab Admissions Coordinator:   Covering today for [name].  Pt's family aware to contact her if caregiver support can be identified.  If a member of the treatment team is able to identify caregiver support please contact [name] at [phone].  Will sign off.   1230: Notified by TOC of possible caregiver support through pt's gf, [name]).  Left voicemail to discuss CIR recommendations and goals of CIR.  [name], PT, DPT Admissions Coordinator [phone] 11/16/21  12:01 PM

## 2021-11-16 NOTE — Procedures (Addendum)
Cortrak  Person Inserting Tube:  Kendell Bane C, RD Tube Type:  Cortrak - 43 inches Tube Size:  10 Tube Location:  Left nare Secured by: Bridle Technique Used to Measure Tube Placement:  Marking at nare/corner of mouth Cortrak Secured At:  69 cm   Cortrak Tube Team Note:  Consult received to place a Cortrak feeding tube.  Previous tube hanging by bridle which was removed by cortrak team.   X-ray is required, abdominal x-ray has been ordered by bedside RN. Please confirm tube placement before using the Cortrak tube.   If the tube becomes dislodged please keep the tube and contact the Cortrak team at [email or web address] (password [email or web address]) for replacement.  If after hours and replacement cannot be delayed, place a NG tube and confirm placement with an abdominal x-ray.    Cammy Copa., RD, LDN, CNSC See AMiON for contact information
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
## 2021-11-17 LAB — GLUCOSE, CAPILLARY
Glucose-Capillary: 120 mg/dL — ABNORMAL HIGH (ref 70–99)
Glucose-Capillary: 116 mg/dL — ABNORMAL HIGH (ref 70–99)
Glucose-Capillary: 132 mg/dL — ABNORMAL HIGH (ref 70–99)
Glucose-Capillary: 116 mg/dL — ABNORMAL HIGH (ref 70–99)
Glucose-Capillary: 130 mg/dL — ABNORMAL HIGH (ref 70–99)

## 2021-11-17 MED ORDER — CLONAZEPAM 0.25 MG PO TBDP
0.2500 mg | ORAL_TABLET | Freq: Two times a day (BID) | ORAL | Status: DC | PRN
  Administered 2021-11-17 – 2021-11-21 (×2): 0.25 mg
  Filled 2021-11-17: qty 1

## 2021-11-17 NOTE — TOC Progression Note (Addendum)
Transition of Care [facility]) - Progression Note    Patient Details  Name: Aul Mangieri MRN: [id number] Date of Birth: 1975-06-18  Transition of Care [facility]) CM/SW Contact  Glennon Mac, RN Phone Number: 11/17/2021, 3:00pm  Clinical Narrative:    Patient's significant other, Jasmine, has reached out to [facility] admissions coordinator; she states she will not be able to provide needed assistance at discharge, and patient's family members are unable to do this as well.  Patient ultimately will need SNF placement, which will be difficult due to TBI and no payor source.  I have reached out to [place] with [employer] financial counseling for update on potential Medicaid, as patient likely to need long term placement; she states she will follow-up with family immediately on this.  Anticipate transition to [address] [place], as patient will be difficult to place. Spoke with patient's sister, Gabriel Rung, and she is agreeable to plan for SNF.  Monique's phone #:  [phone]   Expected Discharge Plan: Skilled Nursing Facility Barriers to Discharge: SNF Pending Medicaid  Expected Discharge Plan and Services Expected Discharge Plan: Skilled Nursing Facility   Discharge Planning Services: CM Consult   Living arrangements for the past 2 months: Single Family Home                                       Social Determinants of Health (SDOH) Interventions    Readmission Risk Interventions     No data to display         Quintella Baton, RN, BSN  Trauma/Neuro ICU Case Manager [phone]

## 2021-11-17 NOTE — Progress Notes (Signed)
16:15- Patient arrived ito room 2W08 via bed as a transfer from 4E. Alert, oriented to self. Disortiented to time, situation, and place. Follows commands. VSS. On RA. Denies pain.
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
## 2021-11-17 NOTE — Progress Notes (Signed)
Inpatient Rehab Admissions Coordinator:   I was able to speak to pt's significant other, Jasmine, regarding CIR recommendations and goals/expectations of CIR stay.  I reviewed expected goals of supervision to min assist and that pt would require 24/7 care, at least initially.  She relayed that she had talked to pt's family and no one was able to provide 24/7 care since they all work.  We will pass on to [facility] for SNF placement.  Cannot pursue CIR at this time.   [name], PT, DPT Admissions Coordinator [phone] 11/17/21  10:17 AM
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
## 2021-11-18 ENCOUNTER — Inpatient Hospital Stay (HOSPITAL_COMMUNITY): Payer: Self-pay

## 2021-11-18 LAB — GLUCOSE, CAPILLARY
Glucose-Capillary: 100 mg/dL — ABNORMAL HIGH (ref 70–99)
Glucose-Capillary: 100 mg/dL — ABNORMAL HIGH (ref 70–99)
Glucose-Capillary: 103 mg/dL — ABNORMAL HIGH (ref 70–99)
Glucose-Capillary: 105 mg/dL — ABNORMAL HIGH (ref 70–99)
Glucose-Capillary: 108 mg/dL — ABNORMAL HIGH (ref 70–99)
Glucose-Capillary: 111 mg/dL — ABNORMAL HIGH (ref 70–99)
Glucose-Capillary: 93 mg/dL (ref 70–99)
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
## 2021-11-18 NOTE — Progress Notes (Signed)
Modified Barium Swallow Progress Note  Patient Details  Name: Ryan Nielsen MRN: [id number] Date of Birth: Jul 27, 1975  Today's Date: 11/18/2021  Modified Barium Swallow completed.  Full report located under Chart Review in the Imaging Section.  Brief recommendations include the following:  Clinical Impression  Pt demonstrates a primary oral dysphagia due to left CN VII and possibly CN V impairment. left labial seal is slack, suspect sensory impairment as well. Pt has poor awareness and tries to push food and straw to left side rather than compensate and keep on right, improved with cues, but pt struggles with a straw and bolus manipulation of puree and regular solids without assist. Pt initially had instances of delayed swallow with flash penetration, but timing improved as study progressed. Recommend dys 2/thin liquids, but pt may need downgrade to puree if oral phase is too laborious. Will f/u for tolerance.   Swallow Evaluation Recommendations       SLP Diet Recommendations: Dysphagia 2 (Fine chop) solids;Thin liquid   Liquid Administration via: Cup;Straw   Medication Administration: Crushed with puree   Supervision: Staff to assist with self feeding   Compensations: Slow rate;Small sips/bites;Monitor for anterior loss;Lingual sweep for clearance of pocketing;Follow solids with liquid   Postural Changes: Seated upright at 90 degrees;Remain semi-upright after after feeds/meals (Comment)   Oral Care Recommendations: Oral care before and after PO   Other Recommendations: Have oral suction available   Harlon Ditty, MA CCC-SLP  Acute Rehabilitation Services Secure Chat Preferred Office [phone]  Claudine Mouton 11/18/2021,11:07 AM

## 2021-11-18 NOTE — Progress Notes (Signed)
Patient added to DTP list for discharge planning.  CSW obtained patient's SSN from preexisting chart in Epic.   Edwin Dada, MSW, LCSW Transitions of Care  Clinical Social Worker II [phone]
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
## 2021-11-18 NOTE — Plan of Care (Signed)

## 2021-11-19 LAB — GLUCOSE, CAPILLARY
Glucose-Capillary: 85 mg/dL (ref 70–99)
Glucose-Capillary: 96 mg/dL (ref 70–99)
Glucose-Capillary: 101 mg/dL — ABNORMAL HIGH (ref 70–99)
Glucose-Capillary: 105 mg/dL — ABNORMAL HIGH (ref 70–99)
Glucose-Capillary: 108 mg/dL — ABNORMAL HIGH (ref 70–99)
Glucose-Capillary: 91 mg/dL (ref 70–99)

## 2021-11-19 MED ORDER — ADULT MULTIVITAMIN W/MINERALS CH
1.0000 | ORAL_TABLET | Freq: Every day | ORAL | Status: DC
  Administered 2021-11-19 – 2021-11-26 (×6): 1 via ORAL
  Filled 2021-11-19 (×9): qty 1

## 2021-11-19 MED ORDER — DOCUSATE SODIUM 100 MG PO CAPS
100.0000 mg | ORAL_CAPSULE | Freq: Two times a day (BID) | ORAL | Status: DC
  Administered 2021-11-19 – 2021-11-25 (×10): 100 mg via ORAL
  Filled 2021-11-19 (×14): qty 1

## 2021-11-19 MED ORDER — OXYCODONE HCL 5 MG PO TABS: 5.0000 mg | ORAL_TABLET | ORAL | Status: DC | PRN

## 2021-11-19 MED ORDER — METHOCARBAMOL 500 MG PO TABS
1000.0000 mg | ORAL_TABLET | Freq: Three times a day (TID) | ORAL | Status: DC
  Administered 2021-11-19 – 2021-11-26 (×16): 1000 mg via ORAL
  Filled 2021-11-19 (×18): qty 2

## 2021-11-19 MED ORDER — ENSURE ENLIVE PO LIQD
237.0000 mL | Freq: Two times a day (BID) | ORAL | Status: DC
Start: 2021-11-19 — End: 2021-11-26
  Administered 2021-11-19 – 2021-11-25 (×6): 237 mL via ORAL

## 2021-11-19 MED ORDER — METOPROLOL TARTRATE 25 MG PO TABS
25.0000 mg | ORAL_TABLET | Freq: Two times a day (BID) | ORAL | Status: DC
  Administered 2021-11-19 – 2021-11-26 (×13): 25 mg via ORAL
  Filled 2021-11-19 (×15): qty 1

## 2021-11-19 MED ORDER — ACETAMINOPHEN 500 MG PO TABS
1000.0000 mg | ORAL_TABLET | Freq: Four times a day (QID) | ORAL | Status: DC
  Administered 2021-11-19 – 2021-11-26 (×17): 1000 mg via ORAL
  Filled 2021-11-19 (×23): qty 2

## 2021-11-19 MED ORDER — QUETIAPINE FUMARATE 25 MG PO TABS
50.0000 mg | ORAL_TABLET | Freq: Two times a day (BID) | ORAL | Status: DC
  Administered 2021-11-19 – 2021-11-20 (×3): 50 mg via ORAL
  Filled 2021-11-19 (×3): qty 2

## 2021-11-19 NOTE — Progress Notes (Signed)
Mobility Specialist Criteria Algorithm Info.   11/19/21 1450  Mobility  Activity Ambulated with assistance in hallway  Range of Motion/Exercises Active;All extremities  Level of Assistance Moderate assist, patient does 50-74%  Assistive Device Other (Comment);None (Hand Rails;Refused AD)  Distance Ambulated (ft) 280 ft  Activity Response Tolerated well   Patient received in supine reluctant to participate in mobility but agreed to participate with encouragement. Pt deferred use of RW despite education on safety. Impulsive requiring max cues for safety. Ambulated mod A with unsteady gait utilizing hand rails in hallway for stability. Returned to room without complaint or incident. Was left lying in bed with all needs met and speech therapist present.   11/19/2021 3:58 PM   , CMS, BS EXP Acute Rehabilitation Services  Phone:[phone] Office: [phone]  

## 2021-11-19 NOTE — Progress Notes (Signed)
PT Cancellation Note  Patient Details Name: Ryan Nielsen MRN: [id number] DOB: 11-14-1975   Cancelled Treatment:    Reason Eval/Treat Not Completed: (P) Other (comment) (Pt refused PT tx despite education and encouragement.  PTA was persistent and he was starting to get agitated.)   Florestine Avers 11/19/2021, 3:39 PM
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
## 2021-11-19 NOTE — Progress Notes (Signed)
Patient refused to eat any of his meals today for RN. Patient's family at bedside. RN discussed patient's likes and dislikes with food. Family states that patient usually enjoys seafood and cooked carrots. RN reordered dinner and family will help encourage patient to continue eating.
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
## 2021-11-20 ENCOUNTER — Encounter (HOSPITAL_COMMUNITY): Payer: Self-pay | Admitting: General Surgery

## 2021-11-20 ENCOUNTER — Inpatient Hospital Stay (HOSPITAL_COMMUNITY): Payer: Self-pay

## 2021-11-20 LAB — GLUCOSE, CAPILLARY
Glucose-Capillary: 98 mg/dL (ref 70–99)
Glucose-Capillary: 117 mg/dL — ABNORMAL HIGH (ref 70–99)
Glucose-Capillary: 117 mg/dL — ABNORMAL HIGH (ref 70–99)

## 2021-11-20 MED ORDER — POLYETHYLENE GLYCOL 3350 17 G PO PACK
17.0000 g | PACK | Freq: Every day | ORAL | Status: DC
  Administered 2021-11-22 – 2021-11-25 (×3): 17 g via ORAL
  Filled 2021-11-20 (×6): qty 1

## 2021-11-20 MED ORDER — PIVOT 1.5 CAL PO LIQD
1000.0000 mL | ORAL | Status: DC
  Administered 2021-11-20 – 2021-11-22 (×3): 1000 mL
  Filled 2021-11-20 (×4): qty 1000

## 2021-11-20 MED ORDER — QUETIAPINE FUMARATE 25 MG PO TABS
25.0000 mg | ORAL_TABLET | Freq: Two times a day (BID) | ORAL | Status: DC
  Administered 2021-11-20 – 2021-11-26 (×10): 25 mg via ORAL
  Filled 2021-11-20 (×12): qty 1

## 2021-11-20 NOTE — Progress Notes (Signed)
Mobility Specialist Progress Note:   11/20/21 1705  Mobility  Activity Ambulated with assistance in hallway  Level of Assistance Contact guard assist, steadying assist  Assistive Device Other (Comment) (hand rails in hallway)  Distance Ambulated (ft) 280 ft  Activity Response Tolerated well  $Mobility charge 1 Mobility   Pt agreeable to mobility session. Declines use of RW at this time. Pt requiring steadying assist throughout, with assistance from handrails in hallway. Pt back in bed with all needs met, bed alarm on.   Nelta Numbers Acute Rehab Secure Chat or Office Phone: [phone]

## 2021-11-20 NOTE — Plan of Care (Signed)
°  Problem: Safety: °Goal: Non-violent Restraint(s) °Outcome: Progressing °  °Problem: Education: °Goal: Knowledge of General Education information will improve °Description: Including pain rating scale, medication(s)/side effects and non-pharmacologic comfort measures °Outcome: Progressing °  °Problem: Health Behavior/Discharge Planning: °Goal: Ability to manage health-related needs will improve °Outcome: Progressing °  °Problem: Clinical Measurements: °Goal: Ability to maintain clinical measurements within normal limits will improve °Outcome: Progressing °Goal: Will remain free from infection °Outcome: Progressing °Goal: Diagnostic test results will improve °Outcome: Progressing °Goal: Respiratory complications will improve °Outcome: Progressing °Goal: Cardiovascular complication will be avoided °Outcome: Progressing °  °Problem: Activity: °Goal: Risk for activity intolerance will decrease °Outcome: Progressing °  °Problem: Nutrition: °Goal: Adequate nutrition will be maintained °Outcome: Progressing °  °Problem: Coping: °Goal: Level of anxiety will decrease °Outcome: Progressing °  °Problem: Pain Managment: °Goal: General experience of comfort will improve °Outcome: Progressing °  °Problem: Safety: °Goal: Ability to remain free from injury will improve °Outcome: Progressing °  °Problem: Skin Integrity: °Goal: Risk for impaired skin integrity will decrease °Outcome: Progressing °  °

## 2021-11-20 NOTE — Procedures (Signed)
Cortrak ? ?Tube Type:  Cortrak - 43 inches ?Tube Location:  Right nare ?Initial Placement:  Stomach ?Secured by: Bridle ?Technique Used to Measure Tube Placement:  Marking at nare/corner of mouth ?Cortrak Secured At:  70 cm ? ?Cortrak Tube Team Note: ? ?Consult received to place a Cortrak feeding tube.  ? ?X-ray is required, abdominal x-ray has been ordered by the Cortrak team. Please confirm tube placement before using the Cortrak tube.  ? ?If the tube becomes dislodged please keep the tube and contact the Cortrak team at [email or web address] (password [email or web address]) for replacement.  ?If after hours and replacement cannot be delayed, place a NG tube and confirm placement with an abdominal x-ray.  ? ? ?Tanaiya Kolarik MS, RD, LDN ?Please refer to AMION for RD and/or RD on-call/weekend/after hours pager ? ? ?

## 2021-11-20 NOTE — Progress Notes (Addendum)
2:30pm: CSW spoke with patient's sister Gabriel Rung to discuss Medicaid application and disability referral. Gabriel Rung confirms she is unable to care for the patient in her home.   12:15pm: CSW obtained PASSR for patient - [id number].  CSW spoke with Eunice Blase at [facility] to request she review clinicals.  Edwin Dada, MSW, LCSW Transitions of Care  Clinical Social Worker II [phone]

## 2021-11-20 NOTE — Progress Notes (Signed)
PT Cancellation Note  Patient Details Name: Ryan Nielsen MRN: [id number] DOB: 25-Nov-1975   Cancelled Treatment:    Reason Eval/Treat Not Completed: (P) Patient declined, no reason specified (Pt very flat and feeling down.  He reports," It doesn't matter." in regards to PT and helping improve his function.  He continues to refuse PT at this time.  May benefit from chaplain consult.)   Florestine Avers 11/20/2021, 3:55 PM  Bonney Leitz , PTA Acute Rehabilitation Services Office [phone]

## 2021-11-20 NOTE — Progress Notes (Signed)
CSW sent patient's clinical information out to local facilities for review.  Edwin Dada, MSW, LCSW Transitions of Care  Clinical Social Worker II [phone]
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
## 2021-11-20 NOTE — Plan of Care (Signed)
  Problem: Clinical Measurements: Goal: Ability to maintain clinical measurements within normal limits will improve Outcome: Progressing   Problem: Activity: Goal: Risk for activity intolerance will decrease Outcome: Progressing   Problem: Nutrition: Goal: Adequate nutrition will be maintained Outcome: Progressing   Problem: Coping: Goal: Level of anxiety will decrease Outcome: Progressing   Problem: Safety: Goal: Non-violent Restraint(s) Outcome: Adequate for Discharge   Problem: Clinical Measurements: Goal: Will remain free from infection Outcome: Adequate for Discharge
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
## 2021-11-21 LAB — GLUCOSE, CAPILLARY
Glucose-Capillary: 94 mg/dL (ref 70–99)
Glucose-Capillary: 100 mg/dL — ABNORMAL HIGH (ref 70–99)
Glucose-Capillary: 137 mg/dL — ABNORMAL HIGH (ref 70–99)
Glucose-Capillary: 103 mg/dL — ABNORMAL HIGH (ref 70–99)
Glucose-Capillary: 114 mg/dL — ABNORMAL HIGH (ref 70–99)
Glucose-Capillary: 101 mg/dL — ABNORMAL HIGH (ref 70–99)
Glucose-Capillary: 142 mg/dL — ABNORMAL HIGH (ref 70–99)

## 2021-11-21 NOTE — Plan of Care (Signed)
  Problem: Education: Goal: Knowledge of General Education information will improve Description: Including pain rating scale, medication(s)/side effects and non-pharmacologic comfort measures Outcome: Progressing   Problem: Health Behavior/Discharge Planning: Goal: Ability to manage health-related needs will improve Outcome: Progressing   Problem: Clinical Measurements: Goal: Ability to maintain clinical measurements within normal limits will improve Outcome: Progressing   Problem: Activity: Goal: Risk for activity intolerance will decrease Outcome: Progressing   Problem: Nutrition: Goal: Adequate nutrition will be maintained Outcome: Progressing   Problem: Coping: Goal: Level of anxiety will decrease Outcome: Progressing   Problem: Elimination: Goal: Will not experience complications related to bowel motility Outcome: Progressing   Problem: Pain Managment: Goal: General experience of comfort will improve Outcome: Progressing   Problem: Safety: Goal: Ability to remain free from injury will improve Outcome: Progressing   Problem: Skin Integrity: Goal: Risk for impaired skin integrity will decrease Outcome: Progressing   Problem: Safety: Goal: Non-violent Restraint(s) Outcome: Adequate for Discharge   Problem: Clinical Measurements: Goal: Will remain free from infection Outcome: Adequate for Discharge Goal: Respiratory complications will improve Outcome: Adequate for Discharge   Problem: Elimination: Goal: Will not experience complications related to urinary retention Outcome: Adequate for Discharge

## 2021-11-21 NOTE — Plan of Care (Signed)

## 2021-11-21 NOTE — Progress Notes (Signed)
PT Cancellation Note  Patient Details Name: Ryan Nielsen MRN: [id number] DOB: Oct 20, 1975   Cancelled Treatment:    Reason Eval/Treat Not Completed: Patient declined, no reason specified. Pt declining, states he "doesn't feel like it. Come back tomorrow". Pt continues to decline despite encouragement. Advised pt it would likely be Monday before anyone could come see him again. Will continue to follow acutely.    Sheral Apley 11/21/2021, 3:28 PM
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
## 2021-11-22 LAB — GLUCOSE, CAPILLARY
Glucose-Capillary: 102 mg/dL — ABNORMAL HIGH (ref 70–99)
Glucose-Capillary: 108 mg/dL — ABNORMAL HIGH (ref 70–99)
Glucose-Capillary: 109 mg/dL — ABNORMAL HIGH (ref 70–99)
Glucose-Capillary: 105 mg/dL — ABNORMAL HIGH (ref 70–99)
Glucose-Capillary: 116 mg/dL — ABNORMAL HIGH (ref 70–99)
Glucose-Capillary: 107 mg/dL — ABNORMAL HIGH (ref 70–99)

## 2021-11-22 MED ORDER — NICOTINE 7 MG/24HR TD PT24
7.0000 mg | MEDICATED_PATCH | Freq: Once | TRANSDERMAL | Status: DC
  Filled 2021-11-22: qty 1

## 2021-11-22 MED ORDER — HALOPERIDOL LACTATE 5 MG/ML IJ SOLN
10.0000 mg | Freq: Four times a day (QID) | INTRAMUSCULAR | Status: DC | PRN
Start: 2021-11-22 — End: 2021-11-23

## 2021-11-22 MED ORDER — HALOPERIDOL LACTATE 5 MG/ML IJ SOLN: 10.0000 mg | Freq: Four times a day (QID) | INTRAMUSCULAR | Status: DC | PRN

## 2021-11-22 NOTE — Progress Notes (Signed)
   11/22/21 1809  Clinical Encounter Type  Visited With Patient;Other (Comment) (Patient talking on phone)  Visit Type Initial;Spiritual support  Referral From Nurse Daphine Deutscher, LPN)  Consult/Referral To Chaplain Benetta Spar)  Recommendations Request for Bible  Spiritual Encounters  Spiritual Needs [facility] text    Spiritual Care Consultation placed by Daphine Deutscher, LPN on 11/22/2021@1511 .  Patient Requesting a Bible. Delivered Bible to patient. Bible delivered to patient.  Talking on phone did not want to speak with chaplain at this time. [address] [place], [place]., [phone]
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
## 2021-11-22 NOTE — Progress Notes (Signed)
Pt has been agitated today and wants to go home. Bed alarm is set. Pt is refusing to eat and take afternoon meds at this time even after talking and educating the importance of the meds.
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
## 2021-11-23 LAB — GLUCOSE, CAPILLARY
Glucose-Capillary: 107 mg/dL — ABNORMAL HIGH (ref 70–99)
Glucose-Capillary: 119 mg/dL — ABNORMAL HIGH (ref 70–99)
Glucose-Capillary: 121 mg/dL — ABNORMAL HIGH (ref 70–99)
Glucose-Capillary: 93 mg/dL (ref 70–99)

## 2021-11-23 MED ORDER — HALOPERIDOL 5 MG PO TABS
5.0000 mg | ORAL_TABLET | Freq: Four times a day (QID) | ORAL | Status: DC | PRN
  Administered 2021-11-23 – 2021-11-24 (×3): 5 mg via ORAL
  Filled 2021-11-23 (×5): qty 1

## 2021-11-23 NOTE — Progress Notes (Signed)
Trauma Event Note   TRN rounding- pt in bed, on phone- safety sitter at bedside. Pt is fidgety, saying "I am trying to get out of here today" redirected conversation- no other complaints.   TRN to follow   Last imported Vital Signs BP (!) 133/92 (BP Location: Left Arm)   Pulse 85   Temp 97.6 F (36.4 C) (Oral)   Resp 20   Ht 5\' 8"  (1.727 m)   Wt 154 lb 8.7 oz (70.1 kg)   SpO2 100%   BMI 23.50 kg/m   Trending CBC No results for input(s): "WBC", "HGB", "HCT", "PLT" in the last 72 hours.  Trending Coag's No results for input(s): "APTT", "INR" in the last 72 hours.  Trending BMET No results for input(s): "NA", "K", "CL", "CO2", "BUN", "CREATININE", "GLUCOSE" in the last 72 hours.    Tierra Thoma  Trauma Response RN  Please call TRN at [phone] for further assistance.
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
## 2021-11-23 NOTE — Progress Notes (Signed)
PT Cancellation Note  Patient Details Name: Ryan Nielsen MRN: [id number] DOB: 1975-06-06   Cancelled Treatment:    Reason Eval/Treat Not Completed: Patient declined, no reason specified (Pt continues to decline PT despite education and encouragement.  Will have supervising PT check on patient next to determine need to stay on caseload if he is not participating.)   Miriya Cloer Artis Delay 11/23/2021, 12:51 PM  Bonney Leitz , PTA Acute Rehabilitation Services Office [phone]

## 2021-11-23 NOTE — Progress Notes (Signed)
  Mobility Specialist Criteria Algorithm Info.    11/23/21 1215  Mobility  Activity Refused mobility (Unspecified)   Will f/u as time permits.  11/23/2021 12:15 PM  [place] Kaito Schulenburg, CMS, BS EXP Acute Rehabilitation Services  Phone:[phone] Office: [phone]

## 2021-11-23 NOTE — Progress Notes (Signed)
CSW spoke with Eunice Blase at [facility] who states she will come visit the patient tomorrow at bedside for an assessment.   Edwin Dada, MSW, LCSW Transitions of Care  Clinical Social Worker II [phone]
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
## 2021-11-24 LAB — GLUCOSE, CAPILLARY
Glucose-Capillary: 107 mg/dL — ABNORMAL HIGH (ref 70–99)
Glucose-Capillary: 94 mg/dL (ref 70–99)
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
## 2021-11-24 NOTE — Plan of Care (Signed)
  Problem: Safety: Goal: Non-violent Restraint(s) Outcome: Not Applicable   Problem: Education: Goal: Knowledge of General Education information will improve Description: Including pain rating scale, medication(s)/side effects and non-pharmacologic comfort measures Outcome: Progressing   Problem: Health Behavior/Discharge Planning: Goal: Ability to manage health-related needs will improve Outcome: Not Progressing   Problem: Clinical Measurements: Goal: Ability to maintain clinical measurements within normal limits will improve Outcome: Progressing Goal: Will remain free from infection Outcome: Progressing Goal: Diagnostic test results will improve Outcome: Progressing Goal: Respiratory complications will improve Outcome: Progressing Goal: Cardiovascular complication will be avoided Outcome: Progressing   Problem: Activity: Goal: Risk for activity intolerance will decrease Outcome: Progressing   Problem: Nutrition: Goal: Adequate nutrition will be maintained Outcome: Progressing   Problem: Coping: Goal: Level of anxiety will decrease Outcome: Progressing   Problem: Elimination: Goal: Will not experience complications related to bowel motility Outcome: Progressing Goal: Will not experience complications related to urinary retention Outcome: Progressing   Problem: Pain Managment: Goal: General experience of comfort will improve Outcome: Progressing   Problem: Safety: Goal: Ability to remain free from injury will improve Outcome: Progressing   Problem: Skin Integrity: Goal: Risk for impaired skin integrity will decrease Outcome: Progressing

## 2021-11-24 NOTE — Progress Notes (Signed)
CSW spoke with Eunice Blase at [place] to inform her of patient's room change.  CSW informed RN of scheduled visit.  Edwin Dada, MSW, LCSW Transitions of Care  Clinical Social Worker II [phone]

## 2021-11-24 NOTE — Progress Notes (Signed)
0930- Patient is refusing all morning medications. Writer called sister to attempt to speak to patient about taking medications, RN/writer reinforced the importance of medication adherence- patient continued to adamantly refuse. Patient also stating that he wants to leave- safety sitter at bedside.
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
## 2021-11-25 LAB — GLUCOSE, CAPILLARY
Glucose-Capillary: 103 mg/dL — ABNORMAL HIGH (ref 70–99)
Glucose-Capillary: 111 mg/dL — ABNORMAL HIGH (ref 70–99)

## 2021-11-25 MED ORDER — CLONAZEPAM 0.25 MG PO TBDP
0.2500 mg | ORAL_TABLET | Freq: Two times a day (BID) | ORAL | Status: DC | PRN
Start: 2021-11-25 — End: 2021-11-26

## 2021-11-25 NOTE — Progress Notes (Signed)
Mobility Specialist Criteria Algorithm Info.   11/25/21 1005  Mobility  Activity Ambulated with assistance in hallway  Range of Motion/Exercises Active;All extremities  Level of Assistance Standby assist, set-up cues, supervision of patient - no hands on  Assistive Device None  Distance Ambulated (ft) 1500 ft (+)  Activity Response Tolerated well   Patient received in supine agreeable to participate in mobility. Ambulated in hallway supervision level with steady gait. Returned to room without complaint or incident. Was left in bed with all needs met, call bell in reach.   11/25/2021 11:15 AM  Martin[place] Edman Lipsey, CMS, [place]  QASUO:[phone] Office: [phone]

## 2021-11-25 NOTE — Progress Notes (Signed)
CSW spoke with Misty Stanley at [facility] who states the locked unit at the facility is only for patient's with Alzheimer's or dementia.  Edwin Dada, MSW, LCSW Transitions of Care  Clinical Social Worker II [phone]
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
## 2021-11-26 ENCOUNTER — Encounter (HOSPITAL_COMMUNITY): Payer: Self-pay

## 2021-11-26 ENCOUNTER — Other Ambulatory Visit (HOSPITAL_COMMUNITY): Payer: Self-pay

## 2021-11-26 MED ORDER — METOPROLOL TARTRATE 25 MG PO TABS
25.0000 mg | ORAL_TABLET | Freq: Two times a day (BID) | ORAL | 0 refills | Status: DC
  Filled 2021-11-26: qty 60, 30d supply, fill #0

## 2021-11-26 MED ORDER — METHOCARBAMOL 500 MG PO TABS
1000.0000 mg | ORAL_TABLET | Freq: Three times a day (TID) | ORAL | 0 refills | Status: DC | PRN
  Filled 2021-11-26: qty 30, 5d supply, fill #0

## 2021-11-26 MED ORDER — BRIMONIDINE TARTRATE 0.2 % OP SOLN
1.0000 [drp] | Freq: Three times a day (TID) | OPHTHALMIC | 1 refills | Status: DC
Start: 2021-11-26 — End: 2022-01-15
  Filled 2021-11-26: qty 10, 30d supply, fill #0

## 2021-11-26 MED ORDER — POLYETHYLENE GLYCOL 3350 17 G PO PACK: 17.0000 g | PACK | Freq: Every day | ORAL | 0 refills | Status: DC | PRN

## 2021-11-26 MED ORDER — QUETIAPINE FUMARATE 25 MG PO TABS
25.0000 mg | ORAL_TABLET | Freq: Two times a day (BID) | ORAL | 0 refills | Status: DC
  Filled 2021-11-26: qty 60, 30d supply, fill #0

## 2021-11-26 MED ORDER — ACETAMINOPHEN 500 MG PO TABS: 1000.0000 mg | ORAL_TABLET | Freq: Four times a day (QID) | ORAL | 0 refills | Status: DC | PRN

## 2021-11-26 NOTE — Progress Notes (Signed)
Patient left to discharge lounge with RN. Girlfriend to come pick the patient up. Sister wanted to speak to social worker regarding discharge asking not to release the patient, provided Child psychotherapist with sister's phone number.

## 2021-11-26 NOTE — Progress Notes (Addendum)
2pm: CSW attempted to reach patient's sister via phone without success.  11:45am: CSW was notified that patient will be discharged this afternoon to his girlfriend's home.  CSW spoke with [place] at [employer] to place order for a charity bedside commode.  Edwin Dada, MSW, LCSW Transitions of Care  Clinical Social Worker II [phone]
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
## 2021-11-26 NOTE — Progress Notes (Signed)
Patient sister call the unit around 5:44 pm wanting to speak to charge nurse about patient discharging from the hospital. Patient sister is yelling at this RN. This RN was not able to explain in full to the sister because sister was yelling. House coverage Doylene Bode) called this RN to find out what the situation was this RN explained to [facility] what occurred.
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
## 2021-11-26 NOTE — Progress Notes (Signed)
Mobility Specialist Criteria Algorithm Info.   11/26/21 1100  Mobility  Activity Ambulated independently in hallway  Range of Motion/Exercises Active;All extremities  Level of Assistance Modified independent, requires aide device or extra time  Assistive Device None  Distance Ambulated (ft) 1500 ft  Activity Response Tolerated well   Patient received dangling EOB agreeable to participate in mobility. Ambulated in hallway independently with steady gait. Returned to room without complaint or incident. Was left dangling EOB with all needs met, call bell in reach.   11/26/2021 12:26 PM  [place] Chevon Laufer, [place], [place]  BTDHR:[phone] Office: [phone]

## 2021-11-27 NOTE — Progress Notes (Signed)
Pt discharged in stable condition, all discharge instructions reviewed with and given to pt.  Pt gives complete verbal understanding and asked appropriate questions. BSC and TOC meds given to pt.  Pt transported via wheelchair with belongings to dc lounge to wait for ride home.

## 2021-11-27 NOTE — Progress Notes (Signed)
11/26/21 1500 Pt present in DC lounge, pt calling sister and girlfriend Jazmine for a ride and both refuse to come pick pt up.  Much encouragement given to family to support pt but family continues to refuse.   11/26/21 1530 CN from 59M advised pt still has belongings with security.  Security contacted and brought belongings to DC lounge, all listed items returned to pt.  11/26/21 1545 Spoke with pt's sister "Nique" and girlfriend Jazmine who states that social work advised that pt was going to long term care at [facility]. This nurse advised that pt recovered well so no longer qualified for inpatient rehab and at this time pt is already discharged.  Also advised that case management was attempting to call her back per her request but no answer. 11/26/2021 1630 Pt requesting a cab to be called instead of continuing to wait on a ride, pt has money and will pay cab fare.  [employer] cab picked pt up for transport to desired location.

## 2021-11-27 NOTE — Progress Notes (Signed)
CSW spoke with patient's sister Gabriel Rung to listen to her concerns regarding patient's discharge yesterday. CSW provided Monique with the contact information for the trauma service office.  Edwin Dada, MSW, LCSW Transitions of Care  Clinical Social Worker II [phone]

## 2021-12-17 ENCOUNTER — Emergency Department (HOSPITAL_COMMUNITY)
Admission: EM | Admit: 2021-12-17 | Discharge: 2021-12-18 | Discharge status: Against Medical Advice | Payer: Self-pay | Attending: Emergency Medicine | Admitting: Emergency Medicine

## 2021-12-17 ENCOUNTER — Other Ambulatory Visit: Payer: Self-pay

## 2021-12-17 DIAGNOSIS — Z5321 Procedure and treatment not carried out due to patient leaving prior to being seen by health care provider: Secondary | ICD-10-CM | POA: Insufficient documentation

## 2021-12-17 DIAGNOSIS — M791 Myalgia, unspecified site: Secondary | ICD-10-CM | POA: Insufficient documentation
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
## 2021-12-18 ENCOUNTER — Encounter (HOSPITAL_COMMUNITY): Payer: Self-pay

## 2021-12-18 ENCOUNTER — Other Ambulatory Visit: Payer: Self-pay

## 2021-12-18 ENCOUNTER — Emergency Department (HOSPITAL_COMMUNITY): Payer: Self-pay

## 2021-12-18 ENCOUNTER — Emergency Department (HOSPITAL_COMMUNITY)
Admission: EM | Admit: 2021-12-18 | Discharge: 2021-12-18 | Discharge status: Against Medical Advice | Payer: Self-pay | Attending: Emergency Medicine | Admitting: Emergency Medicine

## 2021-12-18 DIAGNOSIS — R519 Headache, unspecified: Secondary | ICD-10-CM | POA: Insufficient documentation

## 2021-12-18 DIAGNOSIS — M79606 Pain in leg, unspecified: Secondary | ICD-10-CM | POA: Insufficient documentation
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
## 2021-12-18 NOTE — ED Notes (Signed)
Pt got up and took all his stuff and stating he was done and leaving. This RN and the CRN attempted to redirect and talk to pt and he was not trying to hear it and bypassed [place] and went to the lobby. EDP notified.

## 2021-12-18 NOTE — ED Notes (Signed)
Patient called x2 with no response and not visile in the lobby

## 2021-12-18 NOTE — ED Triage Notes (Signed)
Pt arrived stating he needs to be checked out because his legs and eyes hurt sometimes and he has medicine for that and is about to take it. Pt states he does not actually have pain now.

## 2021-12-21 ENCOUNTER — Emergency Department (HOSPITAL_COMMUNITY): Admission: EM | Admit: 2021-12-21 | Discharge: 2021-12-21 | Discharge status: Against Medical Advice | Payer: Self-pay

## 2021-12-21 ENCOUNTER — Emergency Department (HOSPITAL_COMMUNITY)
Admission: EM | Admit: 2021-12-21 | Discharge: 2021-12-21 | Disposition: A | Discharge status: Home / Self Care | Payer: Self-pay | Attending: Emergency Medicine | Admitting: Emergency Medicine

## 2021-12-21 DIAGNOSIS — R29810 Facial weakness: Secondary | ICD-10-CM | POA: Insufficient documentation

## 2021-12-21 DIAGNOSIS — X58XXXS Exposure to other specified factors, sequela: Secondary | ICD-10-CM | POA: Insufficient documentation

## 2021-12-21 DIAGNOSIS — S069X9S Unspecified intracranial injury with loss of consciousness of unspecified duration, sequela: Secondary | ICD-10-CM | POA: Insufficient documentation

## 2021-12-21 DIAGNOSIS — M79605 Pain in left leg: Secondary | ICD-10-CM | POA: Insufficient documentation

## 2021-12-21 DIAGNOSIS — M542 Cervicalgia: Secondary | ICD-10-CM | POA: Insufficient documentation

## 2021-12-21 NOTE — ED Notes (Signed)
Patient came back to nursing desk requesting discharge paperwork. This nurse went over his paperwork with him. The patient left, ripping his wrist band off onto the floor.

## 2021-12-21 NOTE — ED Notes (Signed)
Pt called multiple times for triage, no answer. 

## 2021-12-21 NOTE — ED Provider Triage Note (Signed)
Emergency Medicine Provider Triage Evaluation Note  Ryan Nielsen , a 46 y.o. male  was evaluated in triage.  Pt complains of neck pain, leg pain.  He states these are ongoing issues that have not fully resolved.  He was recently seen on 7/28.  Appears patient had left AMA.  He states he was told he would get referrals but he does not have the information for the clinics..  Review of Systems  Positive: As above Negative: As above  Physical Exam  BP 133/87 (BP Location: Right Arm)   Pulse 79   Temp 98.4 F (36.9 C) (Oral)   Resp 16   SpO2 100%  Gen:   Awake, no distress   Resp:  Normal effort  MSK:   Moves extremities without difficulty  Other:    Medical Decision Making  Medically screening exam initiated at 6:24 PM.  Appropriate orders placed.  Ryan Nielsen was informed that the remainder of the evaluation will be completed by another provider, this initial triage assessment does not replace that evaluation, and the importance of remaining in the ED until their evaluation is complete.    Ryan [place], PA-C 12/21/21 1825

## 2021-12-21 NOTE — ED Triage Notes (Signed)
Pt c/o bilateral leg pain, R sided neck pain, concern for follow up information on follow up appts w appropriate practices that he did not obtain on last visit r/t leaving AMA.

## 2021-12-21 NOTE — ED Notes (Signed)
Pt called for triage, no answer

## 2021-12-21 NOTE — Discharge Instructions (Addendum)
Please call these people for follow up:    Deno Etienne, MD Follow up.   Contact information: [address] [place] [address] [phone]              Vivia Ewing, DMD Follow up.   Specialty: Dentistry Contact information: [address] [place] [address] [phone]              Bedelia Person, MD Follow up.   Specialty: Neurosurgery Why: As needed Contact information: [address] [place] [address] [phone]              [facility]. Call.   Specialty: Physical Medicine and Rehabilitation Why: They will call you with an appointment for the concussion clinic Contact information: [address], [place] [address] [id number] mc [place] [address] [phone]
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
## 2021-12-21 NOTE — ED Notes (Addendum)
Pt left before this RN could give discharge paperwork or do discharge education. New set of vitals were not able to be taken as patient left before being able to do so.

## 2021-12-28 ENCOUNTER — Ambulatory Visit: Payer: Self-pay

## 2021-12-28 NOTE — Telephone Encounter (Signed)
  Chief Complaint: Needs pain medication Symptoms: Pain Frequency: Ongoing Pertinent Negatives: Patient denies  Disposition: [x] ED /[] Urgent Care (no appt availability in office) / [] Appointment(In office/virtual)/ []  [facility]/ [] Home Care/ [] Refused Recommended Disposition /[x] [facility]/ []  Follow-up with PCP Additional Notes: Returned pt's call regarding refill of pain medication. PT is not established with a PCP.  Ended call to get advice. Advised to send pt to Ed or Mobile clinic for more pain medication. Called pt back and relayed this recommendation. Pt does not want to go all around for this medication. I suggested calling EMS to be taken to the Ed. Pt did not respond to this suggestion.  Summary: Pain management   Pt called requesting to speak to a nurse about his pain management, says he was given a contact to follow up with for more medication but he has lost it. Please advise  Best contact: [phone]      Reason for Disposition  Caller requesting a CONTROLLED substance prescription refill (e.g., narcotics, ADHD medicines)  Answer Assessment - Initial Assessment Questions 1. DRUG NAME: "What medicine do you need to have refilled?"     Pain medication 2. REFILLS REMAINING: "How many refills are remaining?" (Note: The label on the medicine or pill bottle will show how many refills are remaining. If there are no refills remaining, then a renewal may be needed.)     Unsure - but is running low per pt 3. EXPIRATION DATE: "What is the expiration date?" (Note: The label states when the prescription will expire, and thus can no longer be refilled.)      4. PRESCRIBING HCP: "Who prescribed it?" Reason: If prescribed by specialist, call should be referred to that group.     Unsure 5. SYMPTOMS: "Do you have any symptoms?"     pain 6. PREGNANCY: "Is there any chance that you are pregnant?" "When was your last menstrual period?"     na  Protocols used:  Medication Refill and Renewal Call-A-AH

## 2022-01-01 ENCOUNTER — Other Ambulatory Visit: Payer: Self-pay

## 2022-01-01 ENCOUNTER — Encounter (HOSPITAL_COMMUNITY): Payer: Self-pay | Admitting: Emergency Medicine

## 2022-01-01 ENCOUNTER — Emergency Department (HOSPITAL_COMMUNITY)
Admission: EM | Admit: 2022-01-01 | Discharge: 2022-01-01 | Disposition: A | Discharge status: Home / Self Care | Payer: Self-pay | Attending: Emergency Medicine | Admitting: Emergency Medicine

## 2022-01-01 DIAGNOSIS — Z760 Encounter for issue of repeat prescription: Secondary | ICD-10-CM | POA: Insufficient documentation

## 2022-01-01 DIAGNOSIS — M791 Myalgia, unspecified site: Secondary | ICD-10-CM | POA: Insufficient documentation

## 2022-01-01 MED ORDER — METHOCARBAMOL 500 MG PO TABS: 500.0000 mg | ORAL_TABLET | Freq: Two times a day (BID) | ORAL | 0 refills | Status: DC
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
## 2022-01-01 NOTE — ED Triage Notes (Signed)
Patient requesting prescription refill for his headache/body aches .

## 2022-01-01 NOTE — Discharge Instructions (Addendum)
Take the prescribed medication as directed.  Can continue taking tylenol/motrin with this for pain control. Follow-up with specialists. Return to the ED for new or worsening symptoms.

## 2022-01-12 ENCOUNTER — Ambulatory Visit (HOSPITAL_COMMUNITY)
Admission: EM | Admit: 2022-01-12 | Discharge: 2022-01-12 | Disposition: A | Discharge status: Home / Self Care | Payer: Self-pay

## 2022-01-12 NOTE — ED Notes (Signed)
Patient access reports patient left lwbs

## 2022-01-15 ENCOUNTER — Ambulatory Visit: Payer: Self-pay | Attending: Nurse Practitioner | Admitting: Nurse Practitioner

## 2022-01-15 ENCOUNTER — Other Ambulatory Visit: Payer: Self-pay

## 2022-01-15 ENCOUNTER — Encounter: Payer: Self-pay | Admitting: Nurse Practitioner

## 2022-01-15 ENCOUNTER — Telehealth: Payer: Self-pay | Admitting: Nurse Practitioner

## 2022-01-15 VITALS — BP 135/80 | HR 87 | Temp 98.6°F | Resp 16 | Ht 69.0 in | Wt 157.0 lb

## 2022-01-15 DIAGNOSIS — Z1159 Encounter for screening for other viral diseases: Secondary | ICD-10-CM

## 2022-01-15 DIAGNOSIS — H40052 Ocular hypertension, left eye: Secondary | ICD-10-CM

## 2022-01-15 DIAGNOSIS — T07XXXA Unspecified multiple injuries, initial encounter: Secondary | ICD-10-CM

## 2022-01-15 DIAGNOSIS — Z7689 Persons encountering health services in other specified circumstances: Secondary | ICD-10-CM

## 2022-01-15 DIAGNOSIS — Z5902 Unsheltered homelessness: Secondary | ICD-10-CM

## 2022-01-15 DIAGNOSIS — F331 Major depressive disorder, recurrent, moderate: Secondary | ICD-10-CM

## 2022-01-15 DIAGNOSIS — R7989 Other specified abnormal findings of blood chemistry: Secondary | ICD-10-CM

## 2022-01-15 MED ORDER — BRIMONIDINE TARTRATE 0.2 % OP SOLN
1.0000 [drp] | Freq: Three times a day (TID) | OPHTHALMIC | 1 refills | Status: DC
  Filled 2022-01-15: qty 10, 67d supply, fill #0
  Filled 2022-01-22: qty 10, 67d supply, fill #1

## 2022-01-15 MED ORDER — METHOCARBAMOL 500 MG PO TABS
500.0000 mg | ORAL_TABLET | Freq: Two times a day (BID) | ORAL | 1 refills | Status: DC
  Filled 2022-01-15: qty 60, 30d supply, fill #0
  Filled 2022-01-22: qty 60, 30d supply, fill #1

## 2022-01-15 MED ORDER — MELOXICAM 7.5 MG PO TABS
7.5000 mg | ORAL_TABLET | Freq: Every day | ORAL | 1 refills | Status: DC
  Filled 2022-01-15: qty 30, 30d supply, fill #0
  Filled 2022-01-22: qty 30, 30d supply, fill #1

## 2022-01-15 MED ORDER — QUETIAPINE FUMARATE 50 MG PO TABS
50.0000 mg | ORAL_TABLET | Freq: Every day | ORAL | 1 refills | Status: DC
  Filled 2022-01-15: qty 30, 30d supply, fill #0
  Filled 2022-01-22: qty 30, 30d supply, fill #1
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
## 2022-01-15 NOTE — Telephone Encounter (Signed)
Pt is calling to see if he left a piece of paper at the office ( would not disclose) Please advise [phone]

## 2022-01-16 LAB — CBC WITH DIFFERENTIAL/PLATELET
Basophils Absolute: 0 10*3/uL (ref 0.0–0.2)
Basos: 1 %
EOS (ABSOLUTE): 0.3 10*3/uL (ref 0.0–0.4)
Eos: 6 %
Hematocrit: 40.5 % (ref 37.5–51.0)
Hemoglobin: 13.9 g/dL (ref 13.0–17.7)
Immature Grans (Abs): 0 10*3/uL (ref 0.0–0.1)
Immature Granulocytes: 0 %
Lymphocytes Absolute: 2.3 10*3/uL (ref 0.7–3.1)
Lymphs: 44 %
MCH: 31.4 pg (ref 26.6–33.0)
MCHC: 34.3 g/dL (ref 31.5–35.7)
MCV: 92 fL (ref 79–97)
Monocytes Absolute: 0.5 10*3/uL (ref 0.1–0.9)
Monocytes: 9 %
Neutrophils Absolute: 2.1 10*3/uL (ref 1.4–7.0)
Neutrophils: 40 %
Platelets: 382 10*3/uL (ref 150–450)
RBC: 4.42 x10E6/uL (ref 4.14–5.80)
RDW: 12.3 % (ref 11.6–15.4)
WBC: 5.2 10*3/uL (ref 3.4–10.8)

## 2022-01-16 LAB — HEPATIC FUNCTION PANEL
ALT: 12 IU/L (ref 0–44)
AST: 18 IU/L (ref 0–40)
Albumin: 4.3 g/dL (ref 4.1–5.1)
Alkaline Phosphatase: 78 IU/L (ref 44–121)
Bilirubin Total: 0.7 mg/dL (ref 0.0–1.2)
Bilirubin, Direct: 0.18 mg/dL (ref 0.00–0.40)
Total Protein: 7.8 g/dL (ref 6.0–8.5)

## 2022-01-16 LAB — HCV INTERPRETATION

## 2022-01-16 LAB — HCV AB W REFLEX TO QUANT PCR: HCV Ab: NONREACTIVE

## 2022-01-18 ENCOUNTER — Other Ambulatory Visit: Payer: Self-pay

## 2022-01-18 NOTE — Telephone Encounter (Signed)
Unable to reach by phone. No paper found.

## 2022-01-20 ENCOUNTER — Encounter: Payer: Self-pay | Admitting: Nurse Practitioner

## 2022-01-20 NOTE — Telephone Encounter (Signed)
Pt called to see how long he would need to wait for disability / he is struggling while in pain and not being able to work but has bills to pay / pt asked for a call to discuss this and see if and when his disability paper work would be completed and sent back / please advise
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
## 2022-01-21 NOTE — Telephone Encounter (Signed)
Call returned and patient aware of paperwork being filled out and will be faxed.

## 2022-01-22 ENCOUNTER — Other Ambulatory Visit: Payer: Self-pay | Admitting: Nurse Practitioner

## 2022-01-22 ENCOUNTER — Other Ambulatory Visit: Payer: Self-pay

## 2022-01-22 DIAGNOSIS — H40052 Ocular hypertension, left eye: Secondary | ICD-10-CM

## 2022-01-22 DIAGNOSIS — T07XXXA Unspecified multiple injuries, initial encounter: Secondary | ICD-10-CM

## 2022-01-22 DIAGNOSIS — F331 Major depressive disorder, recurrent, moderate: Secondary | ICD-10-CM

## 2022-01-22 MED ORDER — QUETIAPINE FUMARATE 50 MG PO TABS: 50.0000 mg | ORAL_TABLET | Freq: Every day | ORAL | 1 refills | Status: DC

## 2022-01-22 MED ORDER — METHOCARBAMOL 500 MG PO TABS
500.0000 mg | ORAL_TABLET | Freq: Two times a day (BID) | ORAL | 1 refills | Status: DC
  Filled 2022-01-22 (×2): qty 60, 30d supply, fill #0

## 2022-01-22 MED ORDER — MELOXICAM 7.5 MG PO TABS: 7.5000 mg | ORAL_TABLET | Freq: Every day | ORAL | 1 refills | Status: DC

## 2022-01-22 MED ORDER — BRIMONIDINE TARTRATE 0.2 % OP SOLN
1.0000 [drp] | Freq: Three times a day (TID) | OPHTHALMIC | 1 refills | Status: DC
  Filled 2022-01-22 (×2): qty 10, 67d supply, fill #0
  Filled 2022-02-17: qty 10, 67d supply, fill #1

## 2022-01-22 MED ORDER — MELOXICAM 7.5 MG PO TABS
7.5000 mg | ORAL_TABLET | Freq: Every day | ORAL | 1 refills | Status: DC
  Filled 2022-01-22 (×2): qty 30, 30d supply, fill #0
  Filled 2022-02-17: qty 30, 30d supply, fill #1

## 2022-01-22 MED ORDER — QUETIAPINE FUMARATE 50 MG PO TABS
50.0000 mg | ORAL_TABLET | Freq: Every day | ORAL | 1 refills | Status: DC
  Filled 2022-01-22 (×2): qty 90, 90d supply, fill #0

## 2022-01-22 MED ORDER — METHOCARBAMOL 500 MG PO TABS: 500.0000 mg | ORAL_TABLET | Freq: Two times a day (BID) | ORAL | 1 refills | Status: DC

## 2022-01-22 MED ORDER — BRIMONIDINE TARTRATE 0.2 % OP SOLN: 1.0000 [drp] | Freq: Three times a day (TID) | OPHTHALMIC | 1 refills | Status: DC

## 2022-02-17 ENCOUNTER — Other Ambulatory Visit: Payer: Self-pay

## 2022-02-17 ENCOUNTER — Telehealth: Payer: Self-pay | Admitting: Nurse Practitioner

## 2022-02-17 ENCOUNTER — Other Ambulatory Visit: Payer: Self-pay | Admitting: Nurse Practitioner

## 2022-02-17 DIAGNOSIS — H40052 Ocular hypertension, left eye: Secondary | ICD-10-CM
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
## 2022-02-17 NOTE — Telephone Encounter (Signed)
Medication Refill - Medication: brimonidine (ALPHAGAN) 0.2 % ophthalmic solution  Has the patient contacted their pharmacy? Yes.    (Agent: If yes, when and what did the pharmacy advise?) contact doctor  Preferred Pharmacy (with phone number or street name):  [facility] Phone:  [phone]  Fax:  [phone]     Has the patient been seen for an appointment in the last year OR does the patient have an upcoming appointment? Yes.    Pt requesting a cb if prescription can not be filled

## 2022-02-17 NOTE — Telephone Encounter (Signed)
He will need a note from the eye doctor verifying the drops and which eyes before I can prescribe.

## 2022-02-17 NOTE — Telephone Encounter (Signed)
[facility] pharmacy called, unable to speak with anyone at this time. Will try again later. Pt should have refill remaining since rx was sent to pharmacy on 01/22/22 #54mL/1 RF.

## 2022-02-17 NOTE — Telephone Encounter (Signed)
Routing to PCP to approve if appropriate. Pt will need follow-up with his Ophthalmologist.

## 2022-02-17 NOTE — Telephone Encounter (Signed)
Requested Prescriptions  Refused Prescriptions Disp Refills  . brimonidine (ALPHAGAN) 0.2 % ophthalmic solution 10 mL 1    Sig: Place 1 drop into the right eye 3 (three) times daily.     Ophthalmology:  Glaucoma Passed - 02/17/2022 12:12 PM      Passed - Valid encounter within last 12 months    Recent Outpatient Visits          1 month ago Encounter to establish care   [facility] Gildardo Pounds, NP      Future Appointments            In 1 month Gildardo Pounds, NP [place]

## 2022-02-17 NOTE — Telephone Encounter (Signed)
Pt was given brimonidine (ALPHAGAN) 0.2 % ophthalmic solutionwhen he was discharged from the hospital.  He told me he is doing 3 drops into BOTH eyes instead of 3 drops into his RIGHT eye as dispensed.  Pt is out of eye drops and the pharmacy suggested Zelda may want to re write the Rx. Pt told me he needs in both eyes.   [place]

## 2022-02-18 ENCOUNTER — Telehealth: Payer: Self-pay | Admitting: Emergency Medicine

## 2022-02-18 NOTE — Telephone Encounter (Signed)
Copied from [place] [id number]. Topic: General - Other >> Feb 17, 2022  5:18 PM Ja-Kwan M wrote: Reason for CRM: Pt called in very upset because he is at the pharmacy and the Rx for the eye drops was not received. Pt stated he was told to go to the pharmacy to get the eye drops and it would be $10 but the Rx has not been sent. Pt requests that the Rx be sent to the pharmacy today.

## 2022-02-18 NOTE — Telephone Encounter (Signed)
Pt has received rx.

## 2022-02-18 NOTE — Telephone Encounter (Signed)
Pt has already received the eye drops and will come in to pick up orange card application.

## 2022-02-25 ENCOUNTER — Other Ambulatory Visit: Payer: Self-pay

## 2022-03-29 ENCOUNTER — Other Ambulatory Visit: Payer: Self-pay

## 2022-03-29 ENCOUNTER — Ambulatory Visit: Payer: Self-pay | Attending: Nurse Practitioner | Admitting: Nurse Practitioner

## 2022-03-29 ENCOUNTER — Encounter: Payer: Self-pay | Admitting: Nurse Practitioner

## 2022-03-29 VITALS — BP 138/82 | HR 92 | Temp 97.8°F | Ht 69.0 in | Wt 182.6 lb

## 2022-03-29 DIAGNOSIS — T07XXXA Unspecified multiple injuries, initial encounter: Secondary | ICD-10-CM

## 2022-03-29 DIAGNOSIS — F331 Major depressive disorder, recurrent, moderate: Secondary | ICD-10-CM

## 2022-03-29 MED ORDER — METHOCARBAMOL 500 MG PO TABS
500.0000 mg | ORAL_TABLET | Freq: Two times a day (BID) | ORAL | 1 refills | Status: AC
  Filled 2022-03-29: qty 60, 30d supply, fill #0
  Filled 2022-06-01: qty 180, 90d supply, fill #0
  Filled 2022-12-20: qty 60, 30d supply, fill #1

## 2022-03-29 MED ORDER — QUETIAPINE FUMARATE 100 MG PO TABS
100.0000 mg | ORAL_TABLET | Freq: Every day | ORAL | 1 refills | Status: AC
  Filled 2022-03-29 – 2022-04-05 (×2): qty 30, 30d supply, fill #0
  Filled 2022-06-01: qty 30, 30d supply, fill #1
  Filled 2022-08-05: qty 90, 90d supply, fill #2
  Filled 2022-12-20: qty 30, 30d supply, fill #3

## 2022-03-29 MED ORDER — METOPROLOL TARTRATE 25 MG PO TABS
25.0000 mg | ORAL_TABLET | Freq: Two times a day (BID) | ORAL | 1 refills | Status: DC
  Filled 2022-03-29 – 2022-04-05 (×2): qty 180, 90d supply, fill #0
  Filled 2022-08-05: qty 180, 90d supply, fill #1

## 2022-03-29 MED ORDER — MELOXICAM 7.5 MG PO TABS
7.5000 mg | ORAL_TABLET | Freq: Every day | ORAL | 1 refills | Status: DC
  Filled 2022-03-29: qty 90, 90d supply, fill #0
  Filled 2022-04-05: qty 30, 30d supply, fill #0
  Filled 2022-06-01: qty 30, 30d supply, fill #1
  Filled 2022-08-05: qty 90, 90d supply, fill #2
  Filled 2022-12-20: qty 30, 30d supply, fill #3
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
## 2022-04-02 ENCOUNTER — Other Ambulatory Visit: Payer: Self-pay

## 2022-04-04 ENCOUNTER — Encounter: Payer: Self-pay | Admitting: Nurse Practitioner

## 2022-04-05 ENCOUNTER — Other Ambulatory Visit: Payer: Self-pay

## 2022-04-12 ENCOUNTER — Telehealth: Payer: Medicaid Other | Admitting: Emergency Medicine
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
## 2022-04-12 NOTE — Telephone Encounter (Signed)
I was able to contact patient via phone. Patient identified by name and date of birth. The patient was informed that he would need to apply for financial assistant if he hasn't applied for any other insurances. The current insurance that he has will not pay for the services he is requesting. I then explained the process and what he needed to do. The patient did not like my response and proceeded to tell me that we as his doctors office should have all of this information already and he guesses that he will continue to live the way he has and promptly hung up the phone.

## 2022-05-31 ENCOUNTER — Emergency Department (HOSPITAL_COMMUNITY)
Admission: EM | Admit: 2022-05-31 | Discharge: 2022-06-01 | Disposition: A | Discharge status: Home / Self Care | Payer: Medicaid Other | Attending: Emergency Medicine | Admitting: Emergency Medicine

## 2022-05-31 ENCOUNTER — Other Ambulatory Visit: Payer: Self-pay

## 2022-05-31 ENCOUNTER — Emergency Department (HOSPITAL_COMMUNITY): Payer: Medicaid Other

## 2022-05-31 DIAGNOSIS — R519 Headache, unspecified: Secondary | ICD-10-CM | POA: Insufficient documentation

## 2022-05-31 DIAGNOSIS — R0781 Pleurodynia: Secondary | ICD-10-CM | POA: Diagnosis not present

## 2022-05-31 DIAGNOSIS — R072 Precordial pain: Secondary | ICD-10-CM

## 2022-05-31 NOTE — ED Provider Triage Note (Signed)
  Emergency Medicine Provider Triage Evaluation Note  MRN:  [id number]  Arrival date & time: 05/31/22    Medically screening exam initiated at 11:05 PM.   CC:   Chest Pain and Headache   HPI:  Ryan Nielsen is a 47 y.o. year-old male presents to the ED with chief complaint of chest pain.  States that this is not new.  Reports being under stress and has a lot of pressure due to being homeless.   History provided by patient. ROS:  -As included in HPI PE:   Vitals:   05/31/22 2331  BP: (!) 135/94  Pulse: 80  Resp: 16  Temp: 97.7 F (36.5 C)  SpO2: 95%    Non-toxic appearing No respiratory distress  MDM:  Based on signs and symptoms, anxiety is highest on my differential, followed by ACS. I've ordered labs and imaging in triage to expedite lab/diagnostic workup.  Patient was informed that the remainder of the evaluation will be completed by another provider, this initial triage assessment does not replace that evaluation, and the importance of remaining in the ED until their evaluation is complete.    Montine Circle, PA-C 06/01/22 0000

## 2022-05-31 NOTE — ED Triage Notes (Signed)
Patient reports intermittent central chest pain with headache for several days , denies SOB , no head injury.

## 2022-06-01 ENCOUNTER — Other Ambulatory Visit: Payer: Self-pay

## 2022-06-01 ENCOUNTER — Other Ambulatory Visit: Payer: Self-pay | Admitting: Pharmacist

## 2022-06-01 ENCOUNTER — Other Ambulatory Visit: Payer: Self-pay | Admitting: Nurse Practitioner

## 2022-06-01 DIAGNOSIS — H40052 Ocular hypertension, left eye: Secondary | ICD-10-CM

## 2022-06-01 LAB — CBC
HCT: 46.6 % (ref 39.0–52.0)
Hemoglobin: 15.9 g/dL (ref 13.0–17.0)
MCH: 32.3 pg (ref 26.0–34.0)
MCHC: 34.1 g/dL (ref 30.0–36.0)
MCV: 94.7 fL (ref 80.0–100.0)
Platelets: 395 10*3/uL (ref 150–400)
RBC: 4.92 MIL/uL (ref 4.22–5.81)
RDW: 12.8 % (ref 11.5–15.5)
WBC: 8.5 10*3/uL (ref 4.0–10.5)
nRBC: 0 % (ref 0.0–0.2)

## 2022-06-01 LAB — BASIC METABOLIC PANEL
Anion gap: 9 (ref 5–15)
BUN: 12 mg/dL (ref 6–20)
CO2: 25 mmol/L (ref 22–32)
Calcium: 9.1 mg/dL (ref 8.9–10.3)
Chloride: 103 mmol/L (ref 98–111)
Creatinine, Ser: 0.88 mg/dL (ref 0.61–1.24)
GFR, Estimated: >60 mL/min (ref >60)
Glucose, Bld: 83 mg/dL (ref 70–99)
Potassium: 3.4 mmol/L — ABNORMAL LOW (ref 3.5–5.1)
Sodium: 137 mmol/L (ref 135–145)

## 2022-06-01 LAB — RAPID URINE DRUG SCREEN, HOSP PERFORMED
Amphetamines: NOT DETECTED
Barbiturates: NOT DETECTED
Benzodiazepines: NOT DETECTED
Cocaine: NOT DETECTED
Opiates: NOT DETECTED
Tetrahydrocannabinol: NOT DETECTED

## 2022-06-01 LAB — TROPONIN I (HIGH SENSITIVITY)
Troponin I (High Sensitivity): 3 ng/L (ref <18)
Troponin I (High Sensitivity): 3 ng/L (ref <18)

## 2022-06-01 MED ORDER — BRIMONIDINE TARTRATE 0.2 % OP SOLN
1.0000 [drp] | Freq: Three times a day (TID) | OPHTHALMIC | 1 refills | Status: AC
  Filled 2022-06-01 – 2022-07-05 (×2): qty 10, 67d supply, fill #0

## 2022-06-01 NOTE — Discharge Instructions (Signed)
Outpatient Psychiatry and Counseling  [facility]: Mobile Crisis Management 24 hours:  [phone]  [facility] sliding scale fee and walk in schedule: M-F 8am-12pm/1pm-3pm [address]  [place], [place] [address] [phone]  [facility] [address] [place], [place] [address] [phone]  [facility] ([facility])- new patient walk-in appointments available Monday - Friday 8am -3pm.          [address] [place], [place] [address] [phone] or crisis line- [phone]  [facility]/ Intensive Outpatient Therapy Program [address] [place], [place] [address] [phone]  [facility]                  Crisis Services      [phone]      [address]     [place], [place] [address]                 [facility]   [facility] [phone] [address] [place], [place] [address]   [facility]          [address],  [place], [place] [address]       [phone]  [facility] [address] [place], [place] [address] [phone]  [facility]    [address]    [place], [place] [address]     [phone]       Parish McKinney, MD     [address]     [place] [address]     [phone]       [facility] [address] [place] [address]  [facility]     [address]     [place], [place]      [phone]       [facility] Shamsher Ahluwalia, MD [address] [place], [place] [address] [phone]  [facility]     [address]     [place], [place] [address]     [phone]       [facility] [address] [place], [place] [address] [phone] Resources for Temporary  Residential Assistance/Crisis Centers  [facility] M-F 8am-3pm   [address] [place], [place] [address]   [phone] Services include: laundry, barbering, support groups, case management, phone  & computer access, showers, AA/NA mtgs, mental health/substance abuse nurse, job skills class, disability information, VA assistance, spiritual classes, etc.   HOMELESS SHELTERS  [facility]     [facility]   [address], [place]     [phone]              [facility] (women and children)       [address] [place], [place] [address] [phone] [email or web address] for application and process Application Required  [facility]   [address]    [place] [address]     [phone]                    [facility] [address] , [place] [address] [phone](schedule application appt.) Application Required  [facility] (women only)    [address]     [place],  [address]       [phone]      Intake starts 6pm daily Need valid ID, SSC, & Police report [facility] [place] [address] [place], [place] [phone] Application Required  [facility] (men only)     [address]      [place], [place]     [phone]       [facility] (Pregnant women only) [address] [place], [place] [phone]  [facility]      [address]      [place], [place] [address]     [phone]             [facility] [facility] [address] [place], [place] [phone] 90 day commitment/SA/Application process  [facility](men only)     [address]     [place], [place]     [phone]       Check-in at 7pm            [facility] [address] [place], [place] [address] [phone] Men/Women/Women and Children  must be there by 7 pm  [facility] [place], [place] [phone]                 
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
## 2022-06-07 ENCOUNTER — Other Ambulatory Visit: Payer: Self-pay

## 2022-06-29 ENCOUNTER — Ambulatory Visit: Payer: Medicaid Other | Attending: Nurse Practitioner | Admitting: Nurse Practitioner

## 2022-07-05 ENCOUNTER — Other Ambulatory Visit: Payer: Self-pay

## 2022-08-05 ENCOUNTER — Other Ambulatory Visit: Payer: Self-pay

## 2022-08-09 ENCOUNTER — Emergency Department (HOSPITAL_COMMUNITY): Payer: Medicaid Other

## 2022-08-09 ENCOUNTER — Encounter (HOSPITAL_COMMUNITY): Payer: Self-pay

## 2022-08-09 ENCOUNTER — Other Ambulatory Visit: Payer: Self-pay

## 2022-08-09 ENCOUNTER — Emergency Department (HOSPITAL_COMMUNITY)
Admission: EM | Admit: 2022-08-09 | Discharge: 2022-08-09 | Disposition: A | Discharge status: Home / Self Care | Payer: Medicaid Other | Attending: Emergency Medicine | Admitting: Emergency Medicine

## 2022-08-09 DIAGNOSIS — Z79899 Other long term (current) drug therapy: Secondary | ICD-10-CM | POA: Insufficient documentation

## 2022-08-09 DIAGNOSIS — R079 Chest pain, unspecified: Secondary | ICD-10-CM | POA: Insufficient documentation

## 2022-08-09 DIAGNOSIS — R519 Headache, unspecified: Secondary | ICD-10-CM | POA: Diagnosis not present

## 2022-08-09 DIAGNOSIS — R0602 Shortness of breath: Secondary | ICD-10-CM | POA: Insufficient documentation

## 2022-08-09 LAB — CBC
HCT: 47 % (ref 39.0–52.0)
Hemoglobin: 15.5 g/dL (ref 13.0–17.0)
MCH: 32.2 pg (ref 26.0–34.0)
MCHC: 33 g/dL (ref 30.0–36.0)
MCV: 97.7 fL (ref 80.0–100.0)
Platelets: 311 10*3/uL (ref 150–400)
RBC: 4.81 MIL/uL (ref 4.22–5.81)
RDW: 12.7 % (ref 11.5–15.5)
WBC: 8 10*3/uL (ref 4.0–10.5)
nRBC: 0 % (ref 0.0–0.2)

## 2022-08-09 LAB — BASIC METABOLIC PANEL
Anion gap: 11 (ref 5–15)
BUN: 14 mg/dL (ref 6–20)
CO2: 21 mmol/L — ABNORMAL LOW (ref 22–32)
Calcium: 9 mg/dL (ref 8.9–10.3)
Chloride: 104 mmol/L (ref 98–111)
Creatinine, Ser: 0.98 mg/dL (ref 0.61–1.24)
GFR, Estimated: >60 mL/min (ref >60)
Glucose, Bld: 101 mg/dL — ABNORMAL HIGH (ref 70–99)
Potassium: 3.5 mmol/L (ref 3.5–5.1)
Sodium: 136 mmol/L (ref 135–145)

## 2022-08-09 LAB — TROPONIN I (HIGH SENSITIVITY)
Troponin I (High Sensitivity): 3 ng/L (ref <18)
Troponin I (High Sensitivity): 3 ng/L (ref <18)
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
## 2022-08-09 NOTE — Discharge Instructions (Signed)
Your cardiac testing today was normal. Can take tylenol or motrin as needed. Follow-up with your primary care doctor. Return here for new concerns.

## 2022-12-20 ENCOUNTER — Other Ambulatory Visit: Payer: Self-pay | Admitting: Nurse Practitioner

## 2022-12-20 ENCOUNTER — Other Ambulatory Visit (HOSPITAL_BASED_OUTPATIENT_CLINIC_OR_DEPARTMENT_OTHER): Payer: Self-pay

## 2022-12-20 ENCOUNTER — Other Ambulatory Visit: Payer: Self-pay

## 2022-12-21 ENCOUNTER — Other Ambulatory Visit: Payer: Self-pay

## 2022-12-21 MED ORDER — METOPROLOL TARTRATE 25 MG PO TABS
25.0000 mg | ORAL_TABLET | Freq: Two times a day (BID) | ORAL | 0 refills | Status: AC
  Filled 2022-12-21: qty 60, 30d supply, fill #0

## 2022-12-21 NOTE — Telephone Encounter (Signed)
Courtesy refill. Patient will need an office visit for further refills.

## 2022-12-21 NOTE — Telephone Encounter (Signed)
Called pt [phone] - Person answering phone stated that this is not his number.

## 2022-12-24 ENCOUNTER — Other Ambulatory Visit: Payer: Self-pay

## 2022-12-28 ENCOUNTER — Other Ambulatory Visit: Payer: Self-pay

## 2023-01-10 ENCOUNTER — Other Ambulatory Visit: Payer: Self-pay

## 2023-10-14 ENCOUNTER — Emergency Department (HOSPITAL_COMMUNITY)
Admission: EM | Admit: 2023-10-14 | Discharge: 2023-10-14 | Disposition: A | Discharge status: Home / Self Care | Payer: MEDICAID

## 2023-10-14 ENCOUNTER — Other Ambulatory Visit: Payer: Self-pay

## 2023-10-14 DIAGNOSIS — G8929 Other chronic pain: Secondary | ICD-10-CM | POA: Diagnosis not present

## 2023-10-14 DIAGNOSIS — M25562 Pain in left knee: Secondary | ICD-10-CM | POA: Diagnosis present

## 2023-10-14 MED ORDER — NAPROXEN 500 MG PO TABS
500.0000 mg | ORAL_TABLET | ORAL | 0 refills | Status: AC | PRN
  Filled 2023-10-14: qty 30, 30d supply, fill #0

## 2023-10-14 MED ORDER — NAPROXEN 250 MG PO TABS
500.0000 mg | ORAL_TABLET | Freq: Once | ORAL | Status: AC
  Administered 2023-10-14: 500 mg via ORAL
  Filled 2023-10-14: qty 2

## 2023-10-14 NOTE — Discharge Instructions (Addendum)
 Please take Naprosyn  as needed for pain. Wear knee brace when walking long distances. Follow up with PCP. Return with new or worsening symptoms.

## 2023-10-14 NOTE — ED Triage Notes (Signed)
 Patient reports chronic left knee pain , homeless,walks all day. Ambulatory /denies recent injury.
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
## 2023-10-14 NOTE — ED Notes (Signed)
 Ortho tech at bedside

## 2023-10-26 ENCOUNTER — Other Ambulatory Visit: Payer: Self-pay

## 2024-01-11 ENCOUNTER — Emergency Department (HOSPITAL_COMMUNITY): Payer: MEDICAID

## 2024-01-11 ENCOUNTER — Emergency Department (HOSPITAL_COMMUNITY)
Admission: EM | Admit: 2024-01-11 | Discharge: 2024-01-11 | Disposition: A | Discharge status: Home / Self Care | Payer: MEDICAID | Attending: Emergency Medicine | Admitting: Emergency Medicine

## 2024-01-11 ENCOUNTER — Other Ambulatory Visit: Payer: Self-pay

## 2024-01-11 DIAGNOSIS — S0181XA Laceration without foreign body of other part of head, initial encounter: Secondary | ICD-10-CM | POA: Insufficient documentation

## 2024-01-11 DIAGNOSIS — S0993XA Unspecified injury of face, initial encounter: Secondary | ICD-10-CM | POA: Diagnosis present

## 2024-01-11 DIAGNOSIS — S0502XA Injury of conjunctiva and corneal abrasion without foreign body, left eye, initial encounter: Secondary | ICD-10-CM | POA: Diagnosis not present

## 2024-01-11 MED ORDER — TETANUS-DIPHTH-ACELL PERTUSSIS 5-2.5-18.5 LF-MCG/0.5 IM SUSY
0.5000 mL | PREFILLED_SYRINGE | Freq: Once | INTRAMUSCULAR | Status: DC
  Filled 2024-01-11 (×2): qty 0.5

## 2024-01-11 MED ORDER — ERYTHROMYCIN 5 MG/GM OP OINT
TOPICAL_OINTMENT | OPHTHALMIC | 0 refills | Status: DC
  Filled 2024-01-11: qty 3.5, 14d supply, fill #0

## 2024-01-11 MED ORDER — TETRACAINE HCL 0.5 % OP SOLN
1.0000 [drp] | Freq: Once | OPHTHALMIC | Status: AC
  Administered 2024-01-11: 1 [drp] via OPHTHALMIC
  Filled 2024-01-11: qty 4

## 2024-01-11 MED ORDER — IBUPROFEN 600 MG PO TABS
600.0000 mg | ORAL_TABLET | Freq: Four times a day (QID) | ORAL | 0 refills | Status: AC | PRN
  Filled 2024-01-11: qty 30, 8d supply, fill #0

## 2024-01-11 MED ORDER — KETOROLAC TROMETHAMINE 30 MG/ML IJ SOLN
30.0000 mg | Freq: Once | INTRAMUSCULAR | Status: AC
  Administered 2024-01-11: 30 mg via INTRAMUSCULAR
  Filled 2024-01-11: qty 1

## 2024-01-11 MED ORDER — OXYCODONE-ACETAMINOPHEN 5-325 MG PO TABS
1.0000 | ORAL_TABLET | Freq: Four times a day (QID) | ORAL | 0 refills | Status: DC | PRN
  Filled 2024-01-11: qty 6, 2d supply, fill #0

## 2024-01-11 MED ORDER — FLUORESCEIN SODIUM 1 MG OP STRP
1.0000 | ORAL_STRIP | Freq: Once | OPHTHALMIC | Status: AC
  Administered 2024-01-11: 1 via OPHTHALMIC
  Filled 2024-01-11: qty 1
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
## 2024-01-18 ENCOUNTER — Other Ambulatory Visit: Payer: Self-pay

## 2024-01-18 ENCOUNTER — Emergency Department (HOSPITAL_COMMUNITY)
Admission: EM | Admit: 2024-01-18 | Discharge: 2024-01-19 | Disposition: A | Discharge status: Home / Self Care | Payer: MEDICAID | Attending: Emergency Medicine | Admitting: Emergency Medicine

## 2024-01-18 DIAGNOSIS — H5712 Ocular pain, left eye: Secondary | ICD-10-CM | POA: Diagnosis present

## 2024-01-18 DIAGNOSIS — H209 Unspecified iridocyclitis: Secondary | ICD-10-CM

## 2024-01-18 DIAGNOSIS — G44309 Post-traumatic headache, unspecified, not intractable: Secondary | ICD-10-CM | POA: Diagnosis not present

## 2024-01-18 DIAGNOSIS — R519 Headache, unspecified: Secondary | ICD-10-CM | POA: Diagnosis present

## 2024-01-18 DIAGNOSIS — H20012 Primary iridocyclitis, left eye: Secondary | ICD-10-CM | POA: Insufficient documentation

## 2024-01-18 DIAGNOSIS — F17200 Nicotine dependence, unspecified, uncomplicated: Secondary | ICD-10-CM | POA: Diagnosis not present
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
## 2024-01-19 ENCOUNTER — Other Ambulatory Visit: Payer: Self-pay

## 2024-01-19 ENCOUNTER — Emergency Department (HOSPITAL_COMMUNITY)
Admission: EM | Admit: 2024-01-19 | Discharge: 2024-01-20 | Disposition: A | Discharge status: Home / Self Care | Payer: MEDICAID | Source: Home / Self Care | Attending: Emergency Medicine | Admitting: Emergency Medicine

## 2024-01-19 DIAGNOSIS — G44309 Post-traumatic headache, unspecified, not intractable: Secondary | ICD-10-CM | POA: Insufficient documentation

## 2024-01-19 DIAGNOSIS — F17200 Nicotine dependence, unspecified, uncomplicated: Secondary | ICD-10-CM | POA: Insufficient documentation

## 2024-01-19 DIAGNOSIS — R519 Headache, unspecified: Secondary | ICD-10-CM

## 2024-01-19 MED ORDER — TETRACAINE HCL 0.5 % OP SOLN
2.0000 [drp] | Freq: Once | OPHTHALMIC | Status: AC
  Administered 2024-01-19: 2 [drp] via OPHTHALMIC
  Filled 2024-01-19: qty 4

## 2024-01-19 MED ORDER — FLUORESCEIN SODIUM 1 MG OP STRP
1.0000 | ORAL_STRIP | Freq: Once | OPHTHALMIC | Status: AC
  Administered 2024-01-19: 1 via OPHTHALMIC
  Filled 2024-01-19: qty 1

## 2024-01-19 NOTE — ED Triage Notes (Signed)
 Pt states his left side of his head is hurting since 4pm. Denies any other s/s at time of triage.
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
## 2024-01-20 ENCOUNTER — Other Ambulatory Visit: Payer: Self-pay

## 2024-01-20 MED ORDER — KETOROLAC TROMETHAMINE 30 MG/ML IJ SOLN
15.0000 mg | Freq: Once | INTRAMUSCULAR | Status: AC
  Administered 2024-01-20: 15 mg via INTRAMUSCULAR
  Filled 2024-01-20: qty 1

## 2024-01-20 MED ORDER — PROCHLORPERAZINE MALEATE 5 MG PO TABS
10.0000 mg | ORAL_TABLET | Freq: Once | ORAL | Status: AC
  Administered 2024-01-20: 10 mg via ORAL
  Filled 2024-01-20: qty 2
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
## 2024-01-20 NOTE — Discharge Instructions (Addendum)
 Please follow-up with your primary care provider for further evaluation of your headaches.  If you develop any life-threatening symptoms return to the emergency department.

## 2024-01-22 ENCOUNTER — Other Ambulatory Visit: Payer: Self-pay

## 2024-01-22 ENCOUNTER — Emergency Department (HOSPITAL_COMMUNITY)
Admission: EM | Admit: 2024-01-22 | Discharge: 2024-01-23 | Disposition: A | Discharge status: Home / Self Care | Payer: MEDICAID | Attending: Emergency Medicine | Admitting: Emergency Medicine

## 2024-01-22 DIAGNOSIS — F0781 Postconcussional syndrome: Secondary | ICD-10-CM | POA: Diagnosis not present

## 2024-01-22 DIAGNOSIS — R519 Headache, unspecified: Secondary | ICD-10-CM | POA: Insufficient documentation

## 2024-01-22 DIAGNOSIS — H1132 Conjunctival hemorrhage, left eye: Secondary | ICD-10-CM | POA: Diagnosis not present
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
## 2024-01-23 ENCOUNTER — Emergency Department (HOSPITAL_COMMUNITY): Payer: MEDICAID

## 2024-01-23 ENCOUNTER — Emergency Department (HOSPITAL_COMMUNITY)
Admission: EM | Admit: 2024-01-23 | Discharge: 2024-01-23 | Disposition: A | Discharge status: Home / Self Care | Payer: MEDICAID | Source: Home / Self Care | Attending: Emergency Medicine | Admitting: Emergency Medicine

## 2024-01-23 DIAGNOSIS — H1132 Conjunctival hemorrhage, left eye: Secondary | ICD-10-CM | POA: Insufficient documentation

## 2024-01-23 DIAGNOSIS — R519 Headache, unspecified: Secondary | ICD-10-CM | POA: Insufficient documentation

## 2024-01-23 MED ORDER — FLUORESCEIN SODIUM 1 MG OP STRP
1.0000 | ORAL_STRIP | Freq: Once | OPHTHALMIC | Status: DC
  Filled 2024-01-23: qty 1

## 2024-01-23 MED ORDER — ACETAMINOPHEN 500 MG PO TABS
1000.0000 mg | ORAL_TABLET | Freq: Once | ORAL | Status: AC
  Administered 2024-01-23: 1000 mg via ORAL
  Filled 2024-01-23: qty 2

## 2024-01-23 MED ORDER — METOCLOPRAMIDE HCL 5 MG/ML IJ SOLN
10.0000 mg | Freq: Once | INTRAMUSCULAR | Status: DC
  Filled 2024-01-23: qty 2

## 2024-01-23 MED ORDER — METOCLOPRAMIDE HCL 5 MG/ML IJ SOLN
INTRAMUSCULAR | Status: AC
  Filled 2024-01-23: qty 2

## 2024-01-23 MED ORDER — IBUPROFEN 800 MG PO TABS
800.0000 mg | ORAL_TABLET | Freq: Once | ORAL | Status: AC
  Administered 2024-01-23: 800 mg via ORAL
  Filled 2024-01-23: qty 1

## 2024-01-23 MED ORDER — IBUPROFEN 400 MG PO TABS
400.0000 mg | ORAL_TABLET | Freq: Once | ORAL | Status: AC | PRN
  Administered 2024-01-23: 400 mg via ORAL
  Filled 2024-01-23: qty 1

## 2024-01-23 MED ORDER — TETRACAINE HCL 0.5 % OP SOLN
2.0000 [drp] | Freq: Once | OPHTHALMIC | Status: DC
  Filled 2024-01-23: qty 4

## 2024-01-23 MED ORDER — ACETAMINOPHEN 325 MG PO TABS
650.0000 mg | ORAL_TABLET | Freq: Four times a day (QID) | ORAL | Status: AC | PRN
  Administered 2024-01-23: 650 mg via ORAL
  Filled 2024-01-23: qty 2

## 2024-01-23 MED ORDER — KETOROLAC TROMETHAMINE 60 MG/2ML IM SOLN
60.0000 mg | Freq: Once | INTRAMUSCULAR | Status: DC
  Filled 2024-01-23: qty 2
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
## 2024-01-23 NOTE — ED Provider Notes (Signed)
 [place] EMERGENCY DEPARTMENT AT [facility] Provider Note   CSN: [id number] Arrival date & time: 01/23/24  2213     Patient presents with: Medication Refill   Ryan Nielsen is a 48 y.o. male.   Patient is a 48 year old male with a history of traumatic brain injury in the past who is presenting today reporting that he is only here to get some Tylenol  and Motrin  because he had an injury over the weekend.  He reports he came to the emergency room and got checked out and his scans were negative and he was given Tylenol  and Motrin .  This significantly helped with his pain but he reports because he is on disability he still waiting for the prescription to come through and he cannot buy the other medications.  He states nothing else is changed he had just as pain on the left side of his head.  His vision is fine he is not having any nausea or vomiting.  The history is provided by the patient and medical records.  Medication Refill      Prior to Admission medications   Medication Sig Start Date End Date Taking? Authorizing Provider  brimonidine  (ALPHAGAN ) 0.2 % ophthalmic solution Place 1 drop into the right eye 3 (three) times daily. 06/01/22   Newlin, Enobong, MD  ibuprofen  (ADVIL ) 600 MG tablet Take 1 tablet (600 mg total) by mouth every 6 (six) hours as needed. 01/11/24   Horton, Charmaine FALCON, MD  meloxicam  (MOBIC ) 7.5 MG tablet Take 1 tablet (7.5 mg total) by mouth daily for joint pain. 03/29/22   Fleming, Zelda W, NP  methocarbamol  (ROBAXIN ) 500 MG tablet Take 1 tablet (500 mg total) by mouth 2 (two) times daily. 03/29/22   Fleming, Zelda W, NP  metoprolol  tartrate (LOPRESSOR ) 25 MG tablet Take 1 tablet (25 mg total) by mouth 2 (two) times daily. (Courtesy refill. Patient will need an office visit for further refills) 12/21/22   Fleming, Zelda W, NP  naproxen  (NAPROSYN ) 500 MG tablet Take 1 tablet (500 mg total) by mouth as needed. 10/14/23   Barrett, Jamie N, PA-C  QUEtiapine  (SEROQUEL )  100 MG tablet Take 1 tablet (100 mg total) by mouth daily. 03/29/22   Fleming, Zelda W, NP    Allergies: Patient has no known allergies.    Review of Systems  Updated Vital Signs BP 132/84 (BP Location: Right Arm)   Pulse 79   Temp (!) 97.5 F (36.4 C)   Resp 18   SpO2 99%   Physical Exam Vitals reviewed.  HENT:     Head: Normocephalic.  Eyes:     Extraocular Movements: Extraocular movements intact.     Pupils: Pupils are equal, round, and reactive to light.     Comments: Left subconjunctival hemorrhage.  Pupils are reactive  Cardiovascular:     Rate and Rhythm: Normal rate.  Pulmonary:     Effort: Pulmonary effort is normal.  Neurological:     Mental Status: He is alert and oriented to person, place, and time. Mental status is at baseline.     Sensory: No sensory deficit.     Motor: No weakness.  Psychiatric:        Mood and Affect: Mood normal.     (all labs ordered are listed, but only abnormal results are displayed) Labs Reviewed - No data to display  EKG: None  Radiology: CT HEAD WO CONTRAST ( ) Result Date: 01/23/2024 CLINICAL DATA:  Initial evaluation for acute headache. EXAM: CT  HEAD WITHOUT CONTRAST TECHNIQUE: Contiguous axial images were obtained from the base of the skull through the vertex without intravenous contrast. RADIATION DOSE REDUCTION: This exam was performed according to the departmental dose-optimization program which includes automated exposure control, adjustment of the mA and/or kV according to patient size and/or use of iterative reconstruction technique. COMPARISON:  CT from 01/11/2024 FINDINGS: Brain: Cerebral volume within normal limits. Chronic bifrontal and left greater than right temporal encephalomalacia, consistent with prior traumatic brain injury. No acute intracranial hemorrhage. No acute large vessel territory infarct. No mass lesion or midline shift. No hydrocephalus or extra-axial fluid collection. Vascular: No abnormal hyperdense  vessel. Skull: Scalp soft tissues demonstrate no acute finding. Calvarium intact. Sinuses/Orbits: Globes and orbital soft tissues demonstrate no acute finding. Paranasal sinuses are largely clear. No significant mastoid effusion. Other: None. IMPRESSION: 1. No acute intracranial abnormality. 2. Chronic bifrontotemporal encephalomalacia, consistent with prior traumatic brain injury. Electronically Signed   By: Morene Hoard M.D.   On: 01/23/2024 02:11     Procedures   Medications Ordered in the ED  acetaminophen  (TYLENOL ) tablet 650 mg (has no administration in time range)  ibuprofen  (ADVIL ) tablet 400 mg (has no administration in time range)                                    Medical Decision Making Risk OTC drugs. Prescription drug management.   Patient presenting today requesting Tylenol  and Motrin .  He reports he still having pain in the left side of his head that he was evaluated for yesterday.  He reports he still waiting for the insurance to come through because he cannot pay for the medication.  He has no new complaints.  He is otherwise well-appearing.  Vital signs are normal.  Patient given Tylenol  and Motrin  and discharged.  CT from yesterday was negative.     Final diagnoses:  Acute intractable headache, unspecified headache type    ED Discharge Orders     None          Doretha Folks, MD 01/23/24 2251

## 2024-01-24 ENCOUNTER — Other Ambulatory Visit: Payer: Self-pay

## 2024-01-24 ENCOUNTER — Emergency Department (HOSPITAL_COMMUNITY)
Admission: EM | Admit: 2024-01-24 | Discharge: 2024-01-25 | Discharge status: Against Medical Advice | Payer: MEDICAID | Attending: Emergency Medicine | Admitting: Emergency Medicine

## 2024-01-24 DIAGNOSIS — Z760 Encounter for issue of repeat prescription: Secondary | ICD-10-CM | POA: Diagnosis present

## 2024-01-24 DIAGNOSIS — Z5321 Procedure and treatment not carried out due to patient leaving prior to being seen by health care provider: Secondary | ICD-10-CM | POA: Insufficient documentation

## 2024-01-24 NOTE — ED Triage Notes (Signed)
 Poor historian pt arrive POV for med refill.

## 2024-01-24 NOTE — ED Triage Notes (Signed)
 Pt wants refill on all of his meds

## 2024-01-25 NOTE — ED Notes (Signed)
 Attempt made to get pt's vitals sign and registration with no answer.

## 2024-01-29 ENCOUNTER — Encounter (HOSPITAL_COMMUNITY): Payer: Self-pay | Admitting: *Deleted

## 2024-01-29 ENCOUNTER — Other Ambulatory Visit: Payer: Self-pay

## 2024-01-29 ENCOUNTER — Emergency Department (HOSPITAL_COMMUNITY)
Admission: EM | Admit: 2024-01-29 | Discharge: 2024-01-29 | Disposition: A | Discharge status: Home / Self Care | Payer: MEDICAID | Attending: Emergency Medicine | Admitting: Emergency Medicine

## 2024-01-29 DIAGNOSIS — M25861 Other specified joint disorders, right knee: Secondary | ICD-10-CM | POA: Insufficient documentation

## 2024-01-29 DIAGNOSIS — M25561 Pain in right knee: Secondary | ICD-10-CM | POA: Diagnosis present
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
## 2024-01-29 NOTE — Discharge Instructions (Signed)
 You were seen for your knee pain in the emergency department.  You likely had a lipoma of your knee.    At home, please use Tylenol  and ibuprofen  for the pain.  You may use a compressive knee wrap or Ace wrap as well.    Check your MyChart online for the results of any tests that had not resulted by the time you left the emergency department.   Follow-up with orthopedics in 1 to 2 weeks.    Return immediately to the emergency department if you experience any of the following: Worsening pain, fevers, worsening swelling, or any other concerning symptoms.    Thank you for visiting our Emergency Department. It was a pleasure taking care of you today.

## 2024-01-29 NOTE — ED Notes (Signed)
 Pt verbalized understanding of discharge instructions. Pt ambulatory at time of discharge.
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
## 2024-02-08 ENCOUNTER — Encounter (HOSPITAL_COMMUNITY): Payer: Self-pay | Admitting: *Deleted

## 2024-02-08 ENCOUNTER — Other Ambulatory Visit: Payer: Self-pay

## 2024-02-08 ENCOUNTER — Emergency Department (HOSPITAL_COMMUNITY): Payer: MEDICAID

## 2024-02-08 ENCOUNTER — Emergency Department (HOSPITAL_COMMUNITY)
Admission: EM | Admit: 2024-02-08 | Discharge: 2024-02-09 | Disposition: A | Discharge status: Home / Self Care | Payer: MEDICAID | Attending: Emergency Medicine | Admitting: Emergency Medicine

## 2024-02-08 DIAGNOSIS — M25561 Pain in right knee: Secondary | ICD-10-CM | POA: Insufficient documentation

## 2024-02-08 NOTE — ED Triage Notes (Signed)
 C/o right knee pain unrelieved with ibuprofen  and tylenol . Requesting xray

## 2024-02-09 ENCOUNTER — Other Ambulatory Visit: Payer: Self-pay

## 2024-02-09 MED ORDER — MELOXICAM 15 MG PO TABS
15.0000 mg | ORAL_TABLET | Freq: Every day | ORAL | 2 refills | Status: AC
Start: 2024-02-09 — End: 2025-02-08
  Filled 2024-02-09: qty 30, 30d supply, fill #0

## 2024-02-09 NOTE — Discharge Instructions (Addendum)
Schedule to see the Orthopaedist for evaltuion  

## 2024-02-09 NOTE — ED Provider Notes (Signed)
 [place] EMERGENCY DEPARTMENT AT [facility] Provider Note   CSN: [id number] Arrival date & time: 02/08/24  2335     Patient presents with: Knee Pain   Ryan Nielsen is a 48 y.o. male.   Patient complains of right knee pain.  Patient has had ongoing pain in his knee.  Patient was recently seen here and was told that he had a cyst on his knee.  Patient states that a friend told him he needed to come back and have an x-ray.  Patient states he has discomfort with walking.  Patient states he is having difficulty doing his normal activities due to the discomfort.  Patient denies any fever or chills he has not had any new injuries   Knee Pain      Prior to Admission medications   Medication Sig Start Date End Date Taking? Authorizing Provider  meloxicam  (MOBIC ) 15 MG tablet Take 1 tablet (15 mg total) by mouth daily. 02/09/24 02/08/25 Yes Bastien Strawser K, PA-C  brimonidine  (ALPHAGAN ) 0.2 % ophthalmic solution Place 1 drop into the right eye 3 (three) times daily. 06/01/22   Newlin, Enobong, MD  ibuprofen  (ADVIL ) 600 MG tablet Take 1 tablet (600 mg total) by mouth every 6 (six) hours as needed. 01/11/24   Horton, Charmaine FALCON, MD  methocarbamol  (ROBAXIN ) 500 MG tablet Take 1 tablet (500 mg total) by mouth 2 (two) times daily. 03/29/22   Fleming, Zelda W, NP  metoprolol  tartrate (LOPRESSOR ) 25 MG tablet Take 1 tablet (25 mg total) by mouth 2 (two) times daily. (Courtesy refill. Patient will need an office visit for further refills) 12/21/22   Fleming, Zelda W, NP  naproxen  (NAPROSYN ) 500 MG tablet Take 1 tablet (500 mg total) by mouth as needed. 10/14/23   Barrett, Warren SAILOR, PA-C  QUEtiapine  (SEROQUEL ) 100 MG tablet Take 1 tablet (100 mg total) by mouth daily. 03/29/22   Fleming, Zelda W, NP    Allergies: Patient has no known allergies.    Review of Systems  Musculoskeletal:  Positive for myalgias. Negative for joint swelling.  All other systems reviewed and are negative.   Updated Vital  Signs BP 124/80   Pulse 64   Temp (!) 97.3 F (36.3 C) (Oral)   Resp 16   SpO2 98%   Physical Exam Vitals reviewed.  Constitutional:      Appearance: Normal appearance.  Musculoskeletal:        General: Swelling and tenderness present.     Comments: 2 cm round area of swelling right lateral knee at midline of patella, feels cystic, full range of motion neurovascular intact  Skin:    General: Skin is warm.  Neurological:     General: No focal deficit present.     Mental Status: He is alert.     (all labs ordered are listed, but only abnormal results are displayed) Labs Reviewed - No data to display  EKG: None  Radiology: DG Knee Complete 4 Views Right Result Date: 02/09/2024 CLINICAL DATA:  Knee pain, history of remote injury in 2023, initial encounter EXAM: RIGHT KNEE - COMPLETE 4+ VIEW COMPARISON:  None Available. FINDINGS: No evidence of fracture, dislocation, or joint effusion. No evidence of arthropathy or other focal bone abnormality. Soft tissues are unremarkable. IMPRESSION: No acute abnormality noted. Electronically Signed   By: Oneil Devonshire M.D.   On: 02/09/2024 00:10     Procedures   Medications Ordered in the ED - No data to display  Medical Decision Making Patient complains of pain to his right knee.  He is requesting an x-ray  Amount and/or Complexity of Data Reviewed Radiology: ordered and independent interpretation performed. Decision-making details documented in ED Course.    Details: X-ray right knee shows no acute findings  Risk Prescription drug management. Risk Details: Patient advised do not believe that soft tissue area of swelling would show up on the x-ray.  He is advised to schedule to see orthopedist for evaluation.  He may be able to have the area surgically removed as it is bothering him.  Patient is given a prescription for meloxicam  and discharged in stable condition        Final diagnoses:  Acute  pain of right knee    ED Discharge Orders          Ordered    meloxicam  (MOBIC ) 15 MG tablet  Daily        02/09/24 0739            An After Visit Summary was printed and given to the patient.    Flint Sonny POUR, PA-C 02/09/24 9257    Pamella Ozell LABOR, DO 02/09/24 1645

## 2024-02-16 ENCOUNTER — Other Ambulatory Visit: Payer: Self-pay

## 2024-02-20 ENCOUNTER — Other Ambulatory Visit: Payer: Self-pay
